# Patient Record
Sex: Male | Born: 1965 | Race: Black or African American | Hispanic: No | Marital: Single | Smoking: Current every day smoker
Health system: Southern US, Community
[De-identification: ages and names within clinical notes are randomized; demographics above are authoritative.]

## PROBLEM LIST (undated history)

## (undated) HISTORY — PX: OTHER SURGICAL HISTORY: SHX169

---

## 2008-12-30 ENCOUNTER — Ambulatory Visit: Payer: Self-pay | Admitting: Internal Medicine

## 2008-12-30 ENCOUNTER — Inpatient Hospital Stay (HOSPITAL_COMMUNITY): Admission: EM | Admit: 2008-12-30 | Discharge: 2009-02-12 | Payer: Self-pay | Admitting: Emergency Medicine

## 2009-01-22 ENCOUNTER — Ambulatory Visit: Payer: Self-pay | Admitting: Infectious Diseases

## 2009-01-27 ENCOUNTER — Ambulatory Visit: Payer: Self-pay | Admitting: Emergency Medicine

## 2009-02-05 ENCOUNTER — Ambulatory Visit: Payer: Self-pay | Admitting: Physical Medicine & Rehabilitation

## 2009-02-12 ENCOUNTER — Inpatient Hospital Stay (HOSPITAL_COMMUNITY)
Admission: EM | Admit: 2009-02-12 | Discharge: 2009-02-18 | Payer: Self-pay | Admitting: Physical Medicine & Rehabilitation

## 2009-02-12 ENCOUNTER — Ambulatory Visit: Payer: Self-pay | Admitting: Physical Medicine & Rehabilitation

## 2009-02-15 ENCOUNTER — Ambulatory Visit: Payer: Self-pay | Admitting: Physical Medicine & Rehabilitation

## 2009-02-17 ENCOUNTER — Ambulatory Visit: Payer: Self-pay | Admitting: Infectious Disease

## 2009-02-28 ENCOUNTER — Ambulatory Visit (HOSPITAL_COMMUNITY): Admission: RE | Admit: 2009-02-28 | Discharge: 2009-02-28 | Payer: Self-pay | Admitting: Nephrology

## 2009-03-10 ENCOUNTER — Ambulatory Visit: Payer: Self-pay | Admitting: Internal Medicine

## 2009-03-10 LAB — CONVERTED CEMR LAB
ALT: 14 units/L (ref 0–53)
AST: 13 units/L (ref 0–37)
CO2: 21 meq/L (ref 19–32)
Calcium: 9.6 mg/dL (ref 8.4–10.5)
Chloride: 102 meq/L (ref 96–112)
Creatinine, Ser: 1.38 mg/dL (ref 0.40–1.50)
Eosinophils Absolute: 0.2 10*3/uL (ref 0.0–0.7)
Lymphs Abs: 1.7 10*3/uL (ref 0.7–4.0)
MCV: 82.6 fL (ref 78.0–100.0)
Monocytes Relative: 8 % (ref 3–12)
Neutro Abs: 2.8 10*3/uL (ref 1.7–7.7)
Neutrophils Relative %: 54 % (ref 43–77)
Platelets: 239 10*3/uL (ref 150–400)
RBC: 4.77 M/uL (ref 4.22–5.81)
Sodium: 139 meq/L (ref 135–145)
Total Bilirubin: 0.3 mg/dL (ref 0.3–1.2)
Total Protein: 7.8 g/dL (ref 6.0–8.3)
WBC: 5.3 10*3/uL (ref 4.0–10.5)

## 2009-06-04 ENCOUNTER — Ambulatory Visit: Payer: Self-pay | Admitting: Internal Medicine

## 2009-06-04 LAB — CONVERTED CEMR LAB
BUN: 20 mg/dL (ref 6–23)
Basophils Absolute: 0.1 10*3/uL (ref 0.0–0.1)
Basophils Relative: 1 % (ref 0–1)
Calcium: 9.5 mg/dL (ref 8.4–10.5)
Creatinine, Ser: 1.61 mg/dL — ABNORMAL HIGH (ref 0.40–1.50)
Eosinophils Absolute: 0.2 10*3/uL (ref 0.0–0.7)
Eosinophils Relative: 4 % (ref 0–5)
HCT: 43.1 % (ref 39.0–52.0)
MCV: 82.7 fL (ref 78.0–100.0)
Neutrophils Relative %: 59 % (ref 43–77)
Platelets: 219 10*3/uL (ref 150–400)
RDW: 21.2 % — ABNORMAL HIGH (ref 11.5–15.5)

## 2010-05-03 LAB — RENAL FUNCTION PANEL
CO2: 23 mEq/L (ref 19–32)
CO2: 24 mEq/L (ref 19–32)
Calcium: 8.7 mg/dL (ref 8.4–10.5)
Chloride: 96 mEq/L (ref 96–112)
GFR calc non Af Amer: 16 mL/min — ABNORMAL LOW (ref >60)
Glucose, Bld: 92 mg/dL (ref 70–99)
Phosphorus: 5.4 mg/dL — ABNORMAL HIGH (ref 2.3–4.6)
Potassium: 3.7 mEq/L (ref 3.5–5.1)
Potassium: 3.8 mEq/L (ref 3.5–5.1)
Sodium: 130 mEq/L — ABNORMAL LOW (ref 135–145)
Sodium: 134 mEq/L — ABNORMAL LOW (ref 135–145)

## 2010-05-03 LAB — CBC
MCHC: 32.8 g/dL (ref 30.0–36.0)
RBC: 3.34 MIL/uL — ABNORMAL LOW (ref 4.22–5.81)
WBC: 7.5 10*3/uL (ref 4.0–10.5)

## 2010-05-18 LAB — GLUCOSE, CAPILLARY
Glucose-Capillary: 101 mg/dL — ABNORMAL HIGH (ref 70–99)
Glucose-Capillary: 104 mg/dL — ABNORMAL HIGH (ref 70–99)
Glucose-Capillary: 106 mg/dL — ABNORMAL HIGH (ref 70–99)
Glucose-Capillary: 107 mg/dL — ABNORMAL HIGH (ref 70–99)
Glucose-Capillary: 108 mg/dL — ABNORMAL HIGH (ref 70–99)
Glucose-Capillary: 108 mg/dL — ABNORMAL HIGH (ref 70–99)
Glucose-Capillary: 111 mg/dL — ABNORMAL HIGH (ref 70–99)
Glucose-Capillary: 111 mg/dL — ABNORMAL HIGH (ref 70–99)
Glucose-Capillary: 112 mg/dL — ABNORMAL HIGH (ref 70–99)
Glucose-Capillary: 113 mg/dL — ABNORMAL HIGH (ref 70–99)
Glucose-Capillary: 113 mg/dL — ABNORMAL HIGH (ref 70–99)
Glucose-Capillary: 114 mg/dL — ABNORMAL HIGH (ref 70–99)
Glucose-Capillary: 114 mg/dL — ABNORMAL HIGH (ref 70–99)
Glucose-Capillary: 116 mg/dL — ABNORMAL HIGH (ref 70–99)
Glucose-Capillary: 117 mg/dL — ABNORMAL HIGH (ref 70–99)
Glucose-Capillary: 119 mg/dL — ABNORMAL HIGH (ref 70–99)
Glucose-Capillary: 120 mg/dL — ABNORMAL HIGH (ref 70–99)
Glucose-Capillary: 122 mg/dL — ABNORMAL HIGH (ref 70–99)
Glucose-Capillary: 122 mg/dL — ABNORMAL HIGH (ref 70–99)
Glucose-Capillary: 123 mg/dL — ABNORMAL HIGH (ref 70–99)
Glucose-Capillary: 124 mg/dL — ABNORMAL HIGH (ref 70–99)
Glucose-Capillary: 126 mg/dL — ABNORMAL HIGH (ref 70–99)
Glucose-Capillary: 126 mg/dL — ABNORMAL HIGH (ref 70–99)
Glucose-Capillary: 126 mg/dL — ABNORMAL HIGH (ref 70–99)
Glucose-Capillary: 127 mg/dL — ABNORMAL HIGH (ref 70–99)
Glucose-Capillary: 129 mg/dL — ABNORMAL HIGH (ref 70–99)
Glucose-Capillary: 131 mg/dL — ABNORMAL HIGH (ref 70–99)
Glucose-Capillary: 131 mg/dL — ABNORMAL HIGH (ref 70–99)
Glucose-Capillary: 133 mg/dL — ABNORMAL HIGH (ref 70–99)
Glucose-Capillary: 134 mg/dL — ABNORMAL HIGH (ref 70–99)
Glucose-Capillary: 135 mg/dL — ABNORMAL HIGH (ref 70–99)
Glucose-Capillary: 138 mg/dL — ABNORMAL HIGH (ref 70–99)
Glucose-Capillary: 141 mg/dL — ABNORMAL HIGH (ref 70–99)
Glucose-Capillary: 142 mg/dL — ABNORMAL HIGH (ref 70–99)
Glucose-Capillary: 151 mg/dL — ABNORMAL HIGH (ref 70–99)

## 2010-05-18 LAB — CBC
HCT: 25.6 % — ABNORMAL LOW (ref 39.0–52.0)
HCT: 27.1 % — ABNORMAL LOW (ref 39.0–52.0)
HCT: 28.8 % — ABNORMAL LOW (ref 39.0–52.0)
HCT: 29.7 % — ABNORMAL LOW (ref 39.0–52.0)
Hemoglobin: 7.5 g/dL — ABNORMAL LOW (ref 13.0–17.0)
Hemoglobin: 8.4 g/dL — ABNORMAL LOW (ref 13.0–17.0)
Hemoglobin: 8.7 g/dL — ABNORMAL LOW (ref 13.0–17.0)
Hemoglobin: 8.7 g/dL — ABNORMAL LOW (ref 13.0–17.0)
Hemoglobin: 9.3 g/dL — ABNORMAL LOW (ref 13.0–17.0)
MCHC: 32 g/dL (ref 30.0–36.0)
MCHC: 32.3 g/dL (ref 30.0–36.0)
MCHC: 32.4 g/dL (ref 30.0–36.0)
MCHC: 32.8 g/dL (ref 30.0–36.0)
MCV: 84.4 fL (ref 78.0–100.0)
MCV: 85.7 fL (ref 78.0–100.0)
MCV: 86.5 fL (ref 78.0–100.0)
MCV: 88.5 fL (ref 78.0–100.0)
Platelets: 250 10*3/uL (ref 150–400)
Platelets: 303 10*3/uL (ref 150–400)
RBC: 2.67 MIL/uL — ABNORMAL LOW (ref 4.22–5.81)
RBC: 2.91 MIL/uL — ABNORMAL LOW (ref 4.22–5.81)
RBC: 3.03 MIL/uL — ABNORMAL LOW (ref 4.22–5.81)
RBC: 3.12 MIL/uL — ABNORMAL LOW (ref 4.22–5.81)
RBC: 3.32 MIL/uL — ABNORMAL LOW (ref 4.22–5.81)
RBC: 3.43 MIL/uL — ABNORMAL LOW (ref 4.22–5.81)
RDW: 20.4 % — ABNORMAL HIGH (ref 11.5–15.5)
RDW: 20.9 % — ABNORMAL HIGH (ref 11.5–15.5)
RDW: 22.2 % — ABNORMAL HIGH (ref 11.5–15.5)
WBC: 11.8 10*3/uL — ABNORMAL HIGH (ref 4.0–10.5)
WBC: 12.6 10*3/uL — ABNORMAL HIGH (ref 4.0–10.5)
WBC: 13.6 10*3/uL — ABNORMAL HIGH (ref 4.0–10.5)
WBC: 7.4 10*3/uL (ref 4.0–10.5)
WBC: 9.1 10*3/uL (ref 4.0–10.5)

## 2010-05-18 LAB — DIFFERENTIAL
Basophils Relative: 0 % (ref 0–1)
Eosinophils Absolute: 0.2 10*3/uL (ref 0.0–0.7)
Eosinophils Relative: 2 % (ref 0–5)
Lymphs Abs: 1.8 10*3/uL (ref 0.7–4.0)
Monocytes Relative: 11 % (ref 3–12)
Neutrophils Relative %: 72 % (ref 43–77)

## 2010-05-18 LAB — RENAL FUNCTION PANEL
Albumin: 2.5 g/dL — ABNORMAL LOW (ref 3.5–5.2)
Albumin: 2.5 g/dL — ABNORMAL LOW (ref 3.5–5.2)
BUN: 110 mg/dL — ABNORMAL HIGH (ref 6–23)
BUN: 122 mg/dL — ABNORMAL HIGH (ref 6–23)
BUN: 35 mg/dL — ABNORMAL HIGH (ref 6–23)
BUN: 61 mg/dL — ABNORMAL HIGH (ref 6–23)
BUN: 61 mg/dL — ABNORMAL HIGH (ref 6–23)
CO2: 21 mEq/L (ref 19–32)
CO2: 22 mEq/L (ref 19–32)
CO2: 25 mEq/L (ref 19–32)
CO2: 25 mEq/L (ref 19–32)
Calcium: 8.9 mg/dL (ref 8.4–10.5)
Calcium: 9.4 mg/dL (ref 8.4–10.5)
Chloride: 100 mEq/L (ref 96–112)
Chloride: 93 mEq/L — ABNORMAL LOW (ref 96–112)
Chloride: 95 mEq/L — ABNORMAL LOW (ref 96–112)
Chloride: 96 mEq/L (ref 96–112)
Creatinine, Ser: 5.58 mg/dL — ABNORMAL HIGH (ref 0.4–1.5)
Creatinine, Ser: 6.48 mg/dL — ABNORMAL HIGH (ref 0.4–1.5)
Creatinine, Ser: 7.82 mg/dL — ABNORMAL HIGH (ref 0.4–1.5)
Creatinine, Ser: 7.88 mg/dL — ABNORMAL HIGH (ref 0.4–1.5)
GFR calc Af Amer: 11 mL/min — ABNORMAL LOW (ref >60)
GFR calc non Af Amer: 8 mL/min — ABNORMAL LOW (ref >60)
GFR calc non Af Amer: 9 mL/min — ABNORMAL LOW (ref >60)
Glucose, Bld: 103 mg/dL — ABNORMAL HIGH (ref 70–99)
Glucose, Bld: 116 mg/dL — ABNORMAL HIGH (ref 70–99)
Glucose, Bld: 119 mg/dL — ABNORMAL HIGH (ref 70–99)
Phosphorus: 9.6 mg/dL — ABNORMAL HIGH (ref 2.3–4.6)
Potassium: 3.5 mEq/L (ref 3.5–5.1)
Potassium: 3.7 mEq/L (ref 3.5–5.1)

## 2010-05-18 LAB — POCT I-STAT 4, (NA,K, GLUC, HGB,HCT): Sodium: 139 mEq/L (ref 135–145)

## 2010-05-18 LAB — COMPREHENSIVE METABOLIC PANEL
ALT: 94 U/L — ABNORMAL HIGH (ref 0–53)
AST: 59 U/L — ABNORMAL HIGH (ref 0–37)
Albumin: 2.1 g/dL — ABNORMAL LOW (ref 3.5–5.2)
Alkaline Phosphatase: 145 U/L — ABNORMAL HIGH (ref 39–117)
Alkaline Phosphatase: 152 U/L — ABNORMAL HIGH (ref 39–117)
Alkaline Phosphatase: 153 U/L — ABNORMAL HIGH (ref 39–117)
BUN: 101 mg/dL — ABNORMAL HIGH (ref 6–23)
BUN: 114 mg/dL — ABNORMAL HIGH (ref 6–23)
BUN: 91 mg/dL — ABNORMAL HIGH (ref 6–23)
CO2: 22 mEq/L (ref 19–32)
CO2: 22 mEq/L (ref 19–32)
CO2: 24 mEq/L (ref 19–32)
Chloride: 89 mEq/L — ABNORMAL LOW (ref 96–112)
Chloride: 99 mEq/L (ref 96–112)
Creatinine, Ser: 5.01 mg/dL — ABNORMAL HIGH (ref 0.4–1.5)
Creatinine, Ser: 7.79 mg/dL — ABNORMAL HIGH (ref 0.4–1.5)
GFR calc Af Amer: 12 mL/min — ABNORMAL LOW (ref >60)
GFR calc non Af Amer: 10 mL/min — ABNORMAL LOW (ref >60)
GFR calc non Af Amer: 8 mL/min — ABNORMAL LOW (ref >60)
Glucose, Bld: 107 mg/dL — ABNORMAL HIGH (ref 70–99)
Glucose, Bld: 120 mg/dL — ABNORMAL HIGH (ref 70–99)
Glucose, Bld: 121 mg/dL — ABNORMAL HIGH (ref 70–99)
Potassium: 3.8 mEq/L (ref 3.5–5.1)
Potassium: 4.4 mEq/L (ref 3.5–5.1)
Potassium: 4.5 mEq/L (ref 3.5–5.1)
Sodium: 137 mEq/L (ref 135–145)
Total Bilirubin: 0.6 mg/dL (ref 0.3–1.2)
Total Bilirubin: 1 mg/dL (ref 0.3–1.2)
Total Protein: 9 g/dL — ABNORMAL HIGH (ref 6.0–8.3)

## 2010-05-18 LAB — IRON AND TIBC
Iron: 22 ug/dL — ABNORMAL LOW (ref 42–135)
Saturation Ratios: 13 % — ABNORMAL LOW (ref 20–55)
TIBC: 169 ug/dL — ABNORMAL LOW (ref 215–435)
UIBC: 147 ug/dL

## 2010-05-18 LAB — BASIC METABOLIC PANEL
BUN: 139 mg/dL — ABNORMAL HIGH (ref 6–23)
CO2: 19 mEq/L (ref 19–32)
CO2: 21 mEq/L (ref 19–32)
Chloride: 98 mEq/L (ref 96–112)
Chloride: 99 mEq/L (ref 96–112)
Creatinine, Ser: 5.45 mg/dL — ABNORMAL HIGH (ref 0.4–1.5)
Creatinine, Ser: 7.19 mg/dL — ABNORMAL HIGH (ref 0.4–1.5)
GFR calc Af Amer: 14 mL/min — ABNORMAL LOW (ref >60)
Glucose, Bld: 115 mg/dL — ABNORMAL HIGH (ref 70–99)

## 2010-05-18 LAB — MAGNESIUM: Magnesium: 1.8 mg/dL (ref 1.5–2.5)

## 2010-05-18 LAB — WOUND CULTURE

## 2010-05-18 LAB — PTH, INTACT AND CALCIUM: Calcium, Total (PTH): 8.1 mg/dL — ABNORMAL LOW (ref 8.4–10.5)

## 2010-05-18 LAB — RETICULOCYTES
RBC.: 3.49 MIL/uL — ABNORMAL LOW (ref 4.22–5.81)
Retic Count, Absolute: 83.8 10*3/uL (ref 19.0–186.0)
Retic Ct Pct: 3.2 % — ABNORMAL HIGH (ref 0.4–3.1)

## 2010-05-18 LAB — NO BLOOD PRODUCTS

## 2010-05-18 LAB — PHOSPHORUS: Phosphorus: 6.5 mg/dL — ABNORMAL HIGH (ref 2.3–4.6)

## 2010-05-18 LAB — PREALBUMIN: Prealbumin: 30 mg/dL (ref 18.0–45.0)

## 2010-05-19 LAB — POCT I-STAT 7, (LYTES, BLD GAS, ICA,H+H)
Acid-Base Excess: 1 mmol/L (ref 0.0–2.0)
Acid-Base Excess: 10 mmol/L — ABNORMAL HIGH (ref 0.0–2.0)
Acid-Base Excess: 10 mmol/L — ABNORMAL HIGH (ref 0.0–2.0)
Acid-Base Excess: 11 mmol/L — ABNORMAL HIGH (ref 0.0–2.0)
Acid-Base Excess: 13 mmol/L — ABNORMAL HIGH (ref 0.0–2.0)
Acid-Base Excess: 13 mmol/L — ABNORMAL HIGH (ref 0.0–2.0)
Acid-Base Excess: 13 mmol/L — ABNORMAL HIGH (ref 0.0–2.0)
Acid-Base Excess: 16 mmol/L — ABNORMAL HIGH (ref 0.0–2.0)
Acid-Base Excess: 16 mmol/L — ABNORMAL HIGH (ref 0.0–2.0)
Acid-Base Excess: 19 mmol/L — ABNORMAL HIGH (ref 0.0–2.0)
Acid-Base Excess: 4 mmol/L — ABNORMAL HIGH (ref 0.0–2.0)
Acid-Base Excess: 4 mmol/L — ABNORMAL HIGH (ref 0.0–2.0)
Acid-Base Excess: 7 mmol/L — ABNORMAL HIGH (ref 0.0–2.0)
Acid-Base Excess: 9 mmol/L — ABNORMAL HIGH (ref 0.0–2.0)
Bicarbonate: 30.4 mEq/L — ABNORMAL HIGH (ref 20.0–24.0)
Bicarbonate: 31.3 mEq/L — ABNORMAL HIGH (ref 20.0–24.0)
Bicarbonate: 32.6 mEq/L — ABNORMAL HIGH (ref 20.0–24.0)
Bicarbonate: 33.4 mEq/L — ABNORMAL HIGH (ref 20.0–24.0)
Bicarbonate: 35.1 mEq/L — ABNORMAL HIGH (ref 20.0–24.0)
Bicarbonate: 35.1 mEq/L — ABNORMAL HIGH (ref 20.0–24.0)
Bicarbonate: 35.2 mEq/L — ABNORMAL HIGH (ref 20.0–24.0)
Bicarbonate: 35.3 mEq/L — ABNORMAL HIGH (ref 20.0–24.0)
Bicarbonate: 36.4 mEq/L — ABNORMAL HIGH (ref 20.0–24.0)
Bicarbonate: 36.7 mEq/L — ABNORMAL HIGH (ref 20.0–24.0)
Bicarbonate: 37.7 mEq/L — ABNORMAL HIGH (ref 20.0–24.0)
Bicarbonate: 38 mEq/L — ABNORMAL HIGH (ref 20.0–24.0)
Bicarbonate: 38.6 mEq/L — ABNORMAL HIGH (ref 20.0–24.0)
Bicarbonate: 39.7 mEq/L — ABNORMAL HIGH (ref 20.0–24.0)
Bicarbonate: 40.7 mEq/L — ABNORMAL HIGH (ref 20.0–24.0)
Bicarbonate: 40.8 mEq/L — ABNORMAL HIGH (ref 20.0–24.0)
Bicarbonate: 41.4 mEq/L — ABNORMAL HIGH (ref 20.0–24.0)
Bicarbonate: 41.7 mEq/L — ABNORMAL HIGH (ref 20.0–24.0)
Bicarbonate: 43.1 mEq/L — ABNORMAL HIGH (ref 20.0–24.0)
Bicarbonate: 45.1 mEq/L — ABNORMAL HIGH (ref 20.0–24.0)
Bicarbonate: 51.8 mEq/L — ABNORMAL HIGH (ref 20.0–24.0)
Calcium, Ion: 0.4 mmol/L — ABNORMAL LOW (ref 1.12–1.32)
Calcium, Ion: 0.43 mmol/L — ABNORMAL LOW (ref 1.12–1.32)
Calcium, Ion: 0.49 mmol/L — ABNORMAL LOW (ref 1.12–1.32)
Calcium, Ion: 1.09 mmol/L — ABNORMAL LOW (ref 1.12–1.32)
Calcium, Ion: 1.1 mmol/L — ABNORMAL LOW (ref 1.12–1.32)
Calcium, Ion: 1.11 mmol/L — ABNORMAL LOW (ref 1.12–1.32)
Calcium, Ion: 1.15 mmol/L (ref 1.12–1.32)
Calcium, Ion: 1.15 mmol/L (ref 1.12–1.32)
Calcium, Ion: 1.16 mmol/L (ref 1.12–1.32)
Calcium, Ion: 1.16 mmol/L (ref 1.12–1.32)
Calcium, Ion: 1.16 mmol/L (ref 1.12–1.32)
Calcium, Ion: 1.18 mmol/L (ref 1.12–1.32)
Calcium, Ion: 1.2 mmol/L (ref 1.12–1.32)
Calcium, Ion: 1.22 mmol/L (ref 1.12–1.32)
Calcium, Ion: 1.22 mmol/L (ref 1.12–1.32)
Calcium, Ion: 1.26 mmol/L (ref 1.12–1.32)
Calcium, Ion: 1.26 mmol/L (ref 1.12–1.32)
Calcium, Ion: 1.26 mmol/L (ref 1.12–1.32)
HCT: 17 % — ABNORMAL LOW (ref 39.0–52.0)
HCT: 18 % — ABNORMAL LOW (ref 39.0–52.0)
HCT: 18 % — ABNORMAL LOW (ref 39.0–52.0)
HCT: 19 % — ABNORMAL LOW (ref 39.0–52.0)
HCT: 19 % — ABNORMAL LOW (ref 39.0–52.0)
HCT: 19 % — ABNORMAL LOW (ref 39.0–52.0)
HCT: 20 % — ABNORMAL LOW (ref 39.0–52.0)
HCT: 20 % — ABNORMAL LOW (ref 39.0–52.0)
HCT: 21 % — ABNORMAL LOW (ref 39.0–52.0)
HCT: 21 % — ABNORMAL LOW (ref 39.0–52.0)
HCT: 21 % — ABNORMAL LOW (ref 39.0–52.0)
HCT: 21 % — ABNORMAL LOW (ref 39.0–52.0)
HCT: 22 % — ABNORMAL LOW (ref 39.0–52.0)
HCT: 22 % — ABNORMAL LOW (ref 39.0–52.0)
HCT: 22 % — ABNORMAL LOW (ref 39.0–52.0)
HCT: 22 % — ABNORMAL LOW (ref 39.0–52.0)
HCT: 22 % — ABNORMAL LOW (ref 39.0–52.0)
HCT: 23 % — ABNORMAL LOW (ref 39.0–52.0)
HCT: 24 % — ABNORMAL LOW (ref 39.0–52.0)
HCT: 24 % — ABNORMAL LOW (ref 39.0–52.0)
HCT: 24 % — ABNORMAL LOW (ref 39.0–52.0)
HCT: 26 % — ABNORMAL LOW (ref 39.0–52.0)
HCT: 26 % — ABNORMAL LOW (ref 39.0–52.0)
HCT: 27 % — ABNORMAL LOW (ref 39.0–52.0)
HCT: 28 % — ABNORMAL LOW (ref 39.0–52.0)
Hemoglobin: 5.8 g/dL — CL (ref 13.0–17.0)
Hemoglobin: 5.8 g/dL — CL (ref 13.0–17.0)
Hemoglobin: 6.1 g/dL — CL (ref 13.0–17.0)
Hemoglobin: 6.5 g/dL — CL (ref 13.0–17.0)
Hemoglobin: 6.5 g/dL — CL (ref 13.0–17.0)
Hemoglobin: 6.5 g/dL — CL (ref 13.0–17.0)
Hemoglobin: 6.8 g/dL — CL (ref 13.0–17.0)
Hemoglobin: 6.8 g/dL — CL (ref 13.0–17.0)
Hemoglobin: 7.1 g/dL — ABNORMAL LOW (ref 13.0–17.0)
Hemoglobin: 7.1 g/dL — ABNORMAL LOW (ref 13.0–17.0)
Hemoglobin: 7.5 g/dL — ABNORMAL LOW (ref 13.0–17.0)
Hemoglobin: 7.5 g/dL — ABNORMAL LOW (ref 13.0–17.0)
Hemoglobin: 7.8 g/dL — ABNORMAL LOW (ref 13.0–17.0)
Hemoglobin: 7.8 g/dL — ABNORMAL LOW (ref 13.0–17.0)
Hemoglobin: 7.8 g/dL — ABNORMAL LOW (ref 13.0–17.0)
Hemoglobin: 8.2 g/dL — ABNORMAL LOW (ref 13.0–17.0)
Hemoglobin: 8.2 g/dL — ABNORMAL LOW (ref 13.0–17.0)
Hemoglobin: 8.8 g/dL — ABNORMAL LOW (ref 13.0–17.0)
Hemoglobin: 8.8 g/dL — ABNORMAL LOW (ref 13.0–17.0)
Hemoglobin: 9.2 g/dL — ABNORMAL LOW (ref 13.0–17.0)
Hemoglobin: 9.5 g/dL — ABNORMAL LOW (ref 13.0–17.0)
O2 Saturation: 67 %
O2 Saturation: 91 %
O2 Saturation: 92 %
O2 Saturation: 92 %
O2 Saturation: 93 %
O2 Saturation: 93 %
O2 Saturation: 94 %
O2 Saturation: 95 %
O2 Saturation: 95 %
O2 Saturation: 95 %
O2 Saturation: 96 %
O2 Saturation: 96 %
O2 Saturation: 99 %
O2 Saturation: 99 %
O2 Saturation: 99 %
O2 Saturation: 99 %
Patient temperature: 100.3
Patient temperature: 101.8
Patient temperature: 96
Patient temperature: 96.5
Patient temperature: 97.2
Patient temperature: 97.4
Patient temperature: 98
Patient temperature: 98.1
Patient temperature: 98.3
Patient temperature: 98.6
Patient temperature: 98.8
Patient temperature: 98.9
Patient temperature: 98.9
Patient temperature: 99.1
Patient temperature: 99.1
Patient temperature: 99.5
Patient temperature: 99.8
Potassium: 3 mEq/L — ABNORMAL LOW (ref 3.5–5.1)
Potassium: 3.1 mEq/L — ABNORMAL LOW (ref 3.5–5.1)
Potassium: 3.2 mEq/L — ABNORMAL LOW (ref 3.5–5.1)
Potassium: 3.3 mEq/L — ABNORMAL LOW (ref 3.5–5.1)
Potassium: 3.4 mEq/L — ABNORMAL LOW (ref 3.5–5.1)
Potassium: 3.4 mEq/L — ABNORMAL LOW (ref 3.5–5.1)
Potassium: 3.5 mEq/L (ref 3.5–5.1)
Potassium: 3.5 mEq/L (ref 3.5–5.1)
Potassium: 3.6 mEq/L (ref 3.5–5.1)
Potassium: 3.6 mEq/L (ref 3.5–5.1)
Potassium: 3.7 mEq/L (ref 3.5–5.1)
Potassium: 3.7 mEq/L (ref 3.5–5.1)
Potassium: 3.7 mEq/L (ref 3.5–5.1)
Potassium: 3.7 mEq/L (ref 3.5–5.1)
Potassium: 3.9 mEq/L (ref 3.5–5.1)
Potassium: 4 mEq/L (ref 3.5–5.1)
Potassium: 4 mEq/L (ref 3.5–5.1)
Sodium: 134 mEq/L — ABNORMAL LOW (ref 135–145)
Sodium: 134 mEq/L — ABNORMAL LOW (ref 135–145)
Sodium: 134 mEq/L — ABNORMAL LOW (ref 135–145)
Sodium: 135 mEq/L (ref 135–145)
Sodium: 135 mEq/L (ref 135–145)
Sodium: 135 mEq/L (ref 135–145)
Sodium: 135 mEq/L (ref 135–145)
Sodium: 136 mEq/L (ref 135–145)
Sodium: 136 mEq/L (ref 135–145)
Sodium: 136 mEq/L (ref 135–145)
Sodium: 137 mEq/L (ref 135–145)
Sodium: 137 mEq/L (ref 135–145)
Sodium: 137 mEq/L (ref 135–145)
Sodium: 137 mEq/L (ref 135–145)
Sodium: 138 mEq/L (ref 135–145)
Sodium: 138 mEq/L (ref 135–145)
Sodium: 138 mEq/L (ref 135–145)
Sodium: 138 mEq/L (ref 135–145)
Sodium: 138 mEq/L (ref 135–145)
Sodium: 138 mEq/L (ref 135–145)
Sodium: 139 mEq/L (ref 135–145)
Sodium: 139 mEq/L (ref 135–145)
Sodium: 139 mEq/L (ref 135–145)
TCO2: 32 mmol/L (ref 0–100)
TCO2: 32 mmol/L (ref 0–100)
TCO2: 33 mmol/L (ref 0–100)
TCO2: 34 mmol/L (ref 0–100)
TCO2: 35 mmol/L (ref 0–100)
TCO2: 35 mmol/L (ref 0–100)
TCO2: 37 mmol/L (ref 0–100)
TCO2: 37 mmol/L (ref 0–100)
TCO2: 37 mmol/L (ref 0–100)
TCO2: 39 mmol/L (ref 0–100)
TCO2: 40 mmol/L (ref 0–100)
TCO2: 40 mmol/L (ref 0–100)
TCO2: 40 mmol/L (ref 0–100)
TCO2: 41 mmol/L (ref 0–100)
TCO2: 43 mmol/L (ref 0–100)
TCO2: 45 mmol/L (ref 0–100)
TCO2: 47 mmol/L (ref 0–100)
pCO2 arterial: 37.5 mmHg (ref 35.0–45.0)
pCO2 arterial: 47.3 mmHg — ABNORMAL HIGH (ref 35.0–45.0)
pCO2 arterial: 47.8 mmHg — ABNORMAL HIGH (ref 35.0–45.0)
pCO2 arterial: 47.8 mmHg — ABNORMAL HIGH (ref 35.0–45.0)
pCO2 arterial: 49.8 mmHg — ABNORMAL HIGH (ref 35.0–45.0)
pCO2 arterial: 51.1 mmHg — ABNORMAL HIGH (ref 35.0–45.0)
pCO2 arterial: 51.7 mmHg — ABNORMAL HIGH (ref 35.0–45.0)
pCO2 arterial: 52.9 mmHg — ABNORMAL HIGH (ref 35.0–45.0)
pCO2 arterial: 53.9 mmHg — ABNORMAL HIGH (ref 35.0–45.0)
pCO2 arterial: 54.3 mmHg — ABNORMAL HIGH (ref 35.0–45.0)
pCO2 arterial: 54.4 mmHg — ABNORMAL HIGH (ref 35.0–45.0)
pCO2 arterial: 55.9 mmHg — ABNORMAL HIGH (ref 35.0–45.0)
pCO2 arterial: 57.4 mmHg (ref 35.0–45.0)
pCO2 arterial: 58.3 mmHg (ref 35.0–45.0)
pCO2 arterial: 58.9 mmHg (ref 35.0–45.0)
pCO2 arterial: 59.1 mmHg (ref 35.0–45.0)
pCO2 arterial: 62.3 mmHg (ref 35.0–45.0)
pCO2 arterial: 62.8 mmHg (ref 35.0–45.0)
pCO2 arterial: 71.2 mmHg (ref 35.0–45.0)
pH, Arterial: 7.329 — ABNORMAL LOW (ref 7.350–7.450)
pH, Arterial: 7.357 (ref 7.350–7.450)
pH, Arterial: 7.376 (ref 7.350–7.450)
pH, Arterial: 7.384 (ref 7.350–7.450)
pH, Arterial: 7.415 (ref 7.350–7.450)
pH, Arterial: 7.424 (ref 7.350–7.450)
pH, Arterial: 7.433 (ref 7.350–7.450)
pH, Arterial: 7.434 (ref 7.350–7.450)
pH, Arterial: 7.441 (ref 7.350–7.450)
pH, Arterial: 7.443 (ref 7.350–7.450)
pH, Arterial: 7.444 (ref 7.350–7.450)
pH, Arterial: 7.446 (ref 7.350–7.450)
pH, Arterial: 7.447 (ref 7.350–7.450)
pH, Arterial: 7.45 (ref 7.350–7.450)
pH, Arterial: 7.462 — ABNORMAL HIGH (ref 7.350–7.450)
pH, Arterial: 7.47 — ABNORMAL HIGH (ref 7.350–7.450)
pH, Arterial: 7.48 — ABNORMAL HIGH (ref 7.350–7.450)
pH, Arterial: 7.482 — ABNORMAL HIGH (ref 7.350–7.450)
pH, Arterial: 7.495 — ABNORMAL HIGH (ref 7.350–7.450)
pH, Arterial: 7.503 — ABNORMAL HIGH (ref 7.350–7.450)
pH, Arterial: 7.51 — ABNORMAL HIGH (ref 7.350–7.450)
pH, Arterial: 7.52 — ABNORMAL HIGH (ref 7.350–7.450)
pH, Arterial: 7.542 — ABNORMAL HIGH (ref 7.350–7.450)
pO2, Arterial: 171 mmHg — ABNORMAL HIGH (ref 80.0–100.0)
pO2, Arterial: 35 mmHg — CL (ref 80.0–100.0)
pO2, Arterial: 36 mmHg — CL (ref 80.0–100.0)
pO2, Arterial: 39 mmHg — CL (ref 80.0–100.0)
pO2, Arterial: 44 mmHg — ABNORMAL LOW (ref 80.0–100.0)
pO2, Arterial: 54 mmHg — ABNORMAL LOW (ref 80.0–100.0)
pO2, Arterial: 59 mmHg — ABNORMAL LOW (ref 80.0–100.0)
pO2, Arterial: 61 mmHg — ABNORMAL LOW (ref 80.0–100.0)
pO2, Arterial: 61 mmHg — ABNORMAL LOW (ref 80.0–100.0)
pO2, Arterial: 62 mmHg — ABNORMAL LOW (ref 80.0–100.0)
pO2, Arterial: 65 mmHg — ABNORMAL LOW (ref 80.0–100.0)
pO2, Arterial: 65 mmHg — ABNORMAL LOW (ref 80.0–100.0)
pO2, Arterial: 66 mmHg — ABNORMAL LOW (ref 80.0–100.0)
pO2, Arterial: 66 mmHg — ABNORMAL LOW (ref 80.0–100.0)
pO2, Arterial: 70 mmHg — ABNORMAL LOW (ref 80.0–100.0)
pO2, Arterial: 71 mmHg — ABNORMAL LOW (ref 80.0–100.0)
pO2, Arterial: 78 mmHg — ABNORMAL LOW (ref 80.0–100.0)
pO2, Arterial: 89 mmHg (ref 80.0–100.0)

## 2010-05-19 LAB — POCT I-STAT EG7
Acid-Base Excess: 10 mmol/L — ABNORMAL HIGH (ref 0.0–2.0)
Acid-Base Excess: 10 mmol/L — ABNORMAL HIGH (ref 0.0–2.0)
Acid-Base Excess: 10 mmol/L — ABNORMAL HIGH (ref 0.0–2.0)
Acid-Base Excess: 10 mmol/L — ABNORMAL HIGH (ref 0.0–2.0)
Acid-Base Excess: 11 mmol/L — ABNORMAL HIGH (ref 0.0–2.0)
Acid-Base Excess: 11 mmol/L — ABNORMAL HIGH (ref 0.0–2.0)
Acid-Base Excess: 11 mmol/L — ABNORMAL HIGH (ref 0.0–2.0)
Acid-Base Excess: 12 mmol/L — ABNORMAL HIGH (ref 0.0–2.0)
Acid-Base Excess: 12 mmol/L — ABNORMAL HIGH (ref 0.0–2.0)
Acid-Base Excess: 12 mmol/L — ABNORMAL HIGH (ref 0.0–2.0)
Acid-Base Excess: 14 mmol/L — ABNORMAL HIGH (ref 0.0–2.0)
Acid-Base Excess: 14 mmol/L — ABNORMAL HIGH (ref 0.0–2.0)
Acid-Base Excess: 14 mmol/L — ABNORMAL HIGH (ref 0.0–2.0)
Acid-Base Excess: 15 mmol/L — ABNORMAL HIGH (ref 0.0–2.0)
Acid-Base Excess: 5 mmol/L — ABNORMAL HIGH (ref 0.0–2.0)
Acid-Base Excess: 6 mmol/L — ABNORMAL HIGH (ref 0.0–2.0)
Acid-Base Excess: 7 mmol/L — ABNORMAL HIGH (ref 0.0–2.0)
Acid-Base Excess: 7 mmol/L — ABNORMAL HIGH (ref 0.0–2.0)
Acid-Base Excess: 8 mmol/L — ABNORMAL HIGH (ref 0.0–2.0)
Acid-Base Excess: 8 mmol/L — ABNORMAL HIGH (ref 0.0–2.0)
Acid-Base Excess: 9 mmol/L — ABNORMAL HIGH (ref 0.0–2.0)
Acid-Base Excess: 9 mmol/L — ABNORMAL HIGH (ref 0.0–2.0)
Acid-Base Excess: 9 mmol/L — ABNORMAL HIGH (ref 0.0–2.0)
Acid-Base Excess: 9 mmol/L — ABNORMAL HIGH (ref 0.0–2.0)
Acid-Base Excess: 9 mmol/L — ABNORMAL HIGH (ref 0.0–2.0)
Bicarbonate: 28.5 mEq/L — ABNORMAL HIGH (ref 20.0–24.0)
Bicarbonate: 32.1 mEq/L — ABNORMAL HIGH (ref 20.0–24.0)
Bicarbonate: 33.2 mEq/L — ABNORMAL HIGH (ref 20.0–24.0)
Bicarbonate: 33.2 mEq/L — ABNORMAL HIGH (ref 20.0–24.0)
Bicarbonate: 33.5 mEq/L — ABNORMAL HIGH (ref 20.0–24.0)
Bicarbonate: 35.2 mEq/L — ABNORMAL HIGH (ref 20.0–24.0)
Bicarbonate: 35.8 mEq/L — ABNORMAL HIGH (ref 20.0–24.0)
Bicarbonate: 36 mEq/L — ABNORMAL HIGH (ref 20.0–24.0)
Bicarbonate: 37.7 mEq/L — ABNORMAL HIGH (ref 20.0–24.0)
Bicarbonate: 38.1 mEq/L — ABNORMAL HIGH (ref 20.0–24.0)
Bicarbonate: 38.4 mEq/L — ABNORMAL HIGH (ref 20.0–24.0)
Bicarbonate: 38.7 mEq/L — ABNORMAL HIGH (ref 20.0–24.0)
Bicarbonate: 38.7 mEq/L — ABNORMAL HIGH (ref 20.0–24.0)
Bicarbonate: 39.5 mEq/L — ABNORMAL HIGH (ref 20.0–24.0)
Bicarbonate: 40.5 mEq/L — ABNORMAL HIGH (ref 20.0–24.0)
Calcium, Ion: 0.28 mmol/L — ABNORMAL LOW (ref 1.12–1.32)
Calcium, Ion: 0.36 mmol/L — ABNORMAL LOW (ref 1.12–1.32)
Calcium, Ion: 0.36 mmol/L — ABNORMAL LOW (ref 1.12–1.32)
Calcium, Ion: 0.38 mmol/L — ABNORMAL LOW (ref 1.12–1.32)
Calcium, Ion: 0.39 mmol/L — ABNORMAL LOW (ref 1.12–1.32)
Calcium, Ion: 0.4 mmol/L — ABNORMAL LOW (ref 1.12–1.32)
Calcium, Ion: 0.41 mmol/L — ABNORMAL LOW (ref 1.12–1.32)
Calcium, Ion: 0.41 mmol/L — ABNORMAL LOW (ref 1.12–1.32)
Calcium, Ion: 0.42 mmol/L — ABNORMAL LOW (ref 1.12–1.32)
Calcium, Ion: 0.42 mmol/L — ABNORMAL LOW (ref 1.12–1.32)
Calcium, Ion: 0.43 mmol/L — ABNORMAL LOW (ref 1.12–1.32)
Calcium, Ion: 0.43 mmol/L — ABNORMAL LOW (ref 1.12–1.32)
Calcium, Ion: 0.43 mmol/L — ABNORMAL LOW (ref 1.12–1.32)
Calcium, Ion: 0.44 mmol/L — ABNORMAL LOW (ref 1.12–1.32)
Calcium, Ion: 0.44 mmol/L — ABNORMAL LOW (ref 1.12–1.32)
Calcium, Ion: 0.44 mmol/L — ABNORMAL LOW (ref 1.12–1.32)
Calcium, Ion: 0.44 mmol/L — ABNORMAL LOW (ref 1.12–1.32)
Calcium, Ion: 0.46 mmol/L — ABNORMAL LOW (ref 1.12–1.32)
Calcium, Ion: 1.13 mmol/L (ref 1.12–1.32)
Calcium, Ion: 1.23 mmol/L (ref 1.12–1.32)
Calcium, Ion: <0.25 mmol/L — ABNORMAL LOW (ref 1.12–1.32)
HCT: 22 % — ABNORMAL LOW (ref 39.0–52.0)
HCT: 23 % — ABNORMAL LOW (ref 39.0–52.0)
HCT: 23 % — ABNORMAL LOW (ref 39.0–52.0)
HCT: 24 % — ABNORMAL LOW (ref 39.0–52.0)
HCT: 24 % — ABNORMAL LOW (ref 39.0–52.0)
HCT: 24 % — ABNORMAL LOW (ref 39.0–52.0)
HCT: 25 % — ABNORMAL LOW (ref 39.0–52.0)
HCT: 26 % — ABNORMAL LOW (ref 39.0–52.0)
HCT: 28 % — ABNORMAL LOW (ref 39.0–52.0)
HCT: 28 % — ABNORMAL LOW (ref 39.0–52.0)
HCT: 29 % — ABNORMAL LOW (ref 39.0–52.0)
HCT: 29 % — ABNORMAL LOW (ref 39.0–52.0)
HCT: 29 % — ABNORMAL LOW (ref 39.0–52.0)
HCT: 29 % — ABNORMAL LOW (ref 39.0–52.0)
HCT: 30 % — ABNORMAL LOW (ref 39.0–52.0)
HCT: 31 % — ABNORMAL LOW (ref 39.0–52.0)
HCT: 32 % — ABNORMAL LOW (ref 39.0–52.0)
Hemoglobin: 10.5 g/dL — ABNORMAL LOW (ref 13.0–17.0)
Hemoglobin: 10.9 g/dL — ABNORMAL LOW (ref 13.0–17.0)
Hemoglobin: 7.5 g/dL — ABNORMAL LOW (ref 13.0–17.0)
Hemoglobin: 7.8 g/dL — ABNORMAL LOW (ref 13.0–17.0)
Hemoglobin: 7.8 g/dL — ABNORMAL LOW (ref 13.0–17.0)
Hemoglobin: 7.8 g/dL — ABNORMAL LOW (ref 13.0–17.0)
Hemoglobin: 8.2 g/dL — ABNORMAL LOW (ref 13.0–17.0)
Hemoglobin: 9.2 g/dL — ABNORMAL LOW (ref 13.0–17.0)
Hemoglobin: 9.5 g/dL — ABNORMAL LOW (ref 13.0–17.0)
Hemoglobin: 9.5 g/dL — ABNORMAL LOW (ref 13.0–17.0)
Hemoglobin: 9.5 g/dL — ABNORMAL LOW (ref 13.0–17.0)
Hemoglobin: 9.5 g/dL — ABNORMAL LOW (ref 13.0–17.0)
Hemoglobin: 9.9 g/dL — ABNORMAL LOW (ref 13.0–17.0)
Hemoglobin: 9.9 g/dL — ABNORMAL LOW (ref 13.0–17.0)
O2 Saturation: 42 %
O2 Saturation: 51 %
O2 Saturation: 57 %
O2 Saturation: 60 %
O2 Saturation: 61 %
O2 Saturation: 62 %
O2 Saturation: 62 %
O2 Saturation: 63 %
O2 Saturation: 63 %
O2 Saturation: 63 %
O2 Saturation: 64 %
O2 Saturation: 64 %
O2 Saturation: 65 %
O2 Saturation: 65 %
O2 Saturation: 65 %
O2 Saturation: 66 %
O2 Saturation: 66 %
O2 Saturation: 66 %
O2 Saturation: 68 %
O2 Saturation: 68 %
O2 Saturation: 72 %
O2 Saturation: 73 %
O2 Saturation: 90 %
Patient temperature: 100
Patient temperature: 100
Patient temperature: 100.3
Patient temperature: 100.6
Patient temperature: 101.8
Patient temperature: 97.4
Patient temperature: 97.5
Patient temperature: 97.9
Patient temperature: 97.9
Patient temperature: 98
Patient temperature: 98.3
Patient temperature: 98.9
Patient temperature: 99.5
Patient temperature: 99.8
Potassium: 3.1 mEq/L — ABNORMAL LOW (ref 3.5–5.1)
Potassium: 3.4 mEq/L — ABNORMAL LOW (ref 3.5–5.1)
Potassium: 3.4 mEq/L — ABNORMAL LOW (ref 3.5–5.1)
Potassium: 3.5 mEq/L (ref 3.5–5.1)
Potassium: 3.6 mEq/L (ref 3.5–5.1)
Potassium: 3.7 mEq/L (ref 3.5–5.1)
Potassium: 3.7 mEq/L (ref 3.5–5.1)
Potassium: 3.7 mEq/L (ref 3.5–5.1)
Potassium: 3.8 mEq/L (ref 3.5–5.1)
Potassium: 3.8 mEq/L (ref 3.5–5.1)
Potassium: 3.8 mEq/L (ref 3.5–5.1)
Potassium: 3.8 mEq/L (ref 3.5–5.1)
Potassium: 3.9 mEq/L (ref 3.5–5.1)
Potassium: 4.2 mEq/L (ref 3.5–5.1)
Potassium: 4.3 mEq/L (ref 3.5–5.1)
Potassium: 5.3 mEq/L — ABNORMAL HIGH (ref 3.5–5.1)
Sodium: 137 mEq/L (ref 135–145)
Sodium: 138 mEq/L (ref 135–145)
Sodium: 138 mEq/L (ref 135–145)
Sodium: 138 mEq/L (ref 135–145)
Sodium: 139 mEq/L (ref 135–145)
Sodium: 139 mEq/L (ref 135–145)
Sodium: 139 mEq/L (ref 135–145)
Sodium: 139 mEq/L (ref 135–145)
Sodium: 139 mEq/L (ref 135–145)
Sodium: 139 mEq/L (ref 135–145)
Sodium: 139 mEq/L (ref 135–145)
Sodium: 140 mEq/L (ref 135–145)
Sodium: 140 mEq/L (ref 135–145)
Sodium: 140 mEq/L (ref 135–145)
Sodium: 140 mEq/L (ref 135–145)
Sodium: 141 mEq/L (ref 135–145)
Sodium: 142 mEq/L (ref 135–145)
Sodium: 142 mEq/L (ref 135–145)
Sodium: 144 mEq/L (ref 135–145)
TCO2: 30 mmol/L (ref 0–100)
TCO2: 33 mmol/L (ref 0–100)
TCO2: 35 mmol/L (ref 0–100)
TCO2: 35 mmol/L (ref 0–100)
TCO2: 36 mmol/L (ref 0–100)
TCO2: 38 mmol/L (ref 0–100)
TCO2: 38 mmol/L (ref 0–100)
TCO2: 39 mmol/L (ref 0–100)
TCO2: 39 mmol/L (ref 0–100)
TCO2: 40 mmol/L (ref 0–100)
TCO2: 40 mmol/L (ref 0–100)
TCO2: 40 mmol/L (ref 0–100)
TCO2: 41 mmol/L (ref 0–100)
TCO2: 41 mmol/L (ref 0–100)
TCO2: 41 mmol/L (ref 0–100)
TCO2: 42 mmol/L (ref 0–100)
TCO2: 42 mmol/L (ref 0–100)
TCO2: 42 mmol/L (ref 0–100)
TCO2: 42 mmol/L (ref 0–100)
pCO2, Ven: 49.3 mmHg (ref 45.0–50.0)
pCO2, Ven: 54.4 mmHg — ABNORMAL HIGH (ref 45.0–50.0)
pCO2, Ven: 55.5 mmHg — ABNORMAL HIGH (ref 45.0–50.0)
pCO2, Ven: 57.5 mmHg — ABNORMAL HIGH (ref 45.0–50.0)
pCO2, Ven: 58 mmHg — ABNORMAL HIGH (ref 45.0–50.0)
pCO2, Ven: 59.6 mmHg — ABNORMAL HIGH (ref 45.0–50.0)
pCO2, Ven: 60.4 mmHg — ABNORMAL HIGH (ref 45.0–50.0)
pCO2, Ven: 60.5 mmHg — ABNORMAL HIGH (ref 45.0–50.0)
pCO2, Ven: 60.7 mmHg — ABNORMAL HIGH (ref 45.0–50.0)
pCO2, Ven: 61.8 mmHg — ABNORMAL HIGH (ref 45.0–50.0)
pCO2, Ven: 63.9 mmHg — ABNORMAL HIGH (ref 45.0–50.0)
pCO2, Ven: 66.3 mmHg — ABNORMAL HIGH (ref 45.0–50.0)
pCO2, Ven: 66.6 mmHg — ABNORMAL HIGH (ref 45.0–50.0)
pCO2, Ven: 66.8 mmHg — ABNORMAL HIGH (ref 45.0–50.0)
pH, Ven: 7.268 (ref 7.250–7.300)
pH, Ven: 7.328 — ABNORMAL HIGH (ref 7.250–7.300)
pH, Ven: 7.334 — ABNORMAL HIGH (ref 7.250–7.300)
pH, Ven: 7.353 — ABNORMAL HIGH (ref 7.250–7.300)
pH, Ven: 7.358 — ABNORMAL HIGH (ref 7.250–7.300)
pH, Ven: 7.36 — ABNORMAL HIGH (ref 7.250–7.300)
pH, Ven: 7.373 — ABNORMAL HIGH (ref 7.250–7.300)
pH, Ven: 7.379 — ABNORMAL HIGH (ref 7.250–7.300)
pH, Ven: 7.38 — ABNORMAL HIGH (ref 7.250–7.300)
pH, Ven: 7.39 — ABNORMAL HIGH (ref 7.250–7.300)
pH, Ven: 7.39 — ABNORMAL HIGH (ref 7.250–7.300)
pH, Ven: 7.419 — ABNORMAL HIGH (ref 7.250–7.300)
pH, Ven: 7.43 — ABNORMAL HIGH (ref 7.250–7.300)
pH, Ven: 7.442 — ABNORMAL HIGH (ref 7.250–7.300)
pO2, Ven: 29 mmHg — CL (ref 30.0–45.0)
pO2, Ven: 31 mmHg (ref 30.0–45.0)
pO2, Ven: 32 mmHg (ref 30.0–45.0)
pO2, Ven: 32 mmHg (ref 30.0–45.0)
pO2, Ven: 34 mmHg (ref 30.0–45.0)
pO2, Ven: 35 mmHg (ref 30.0–45.0)
pO2, Ven: 35 mmHg (ref 30.0–45.0)
pO2, Ven: 36 mmHg (ref 30.0–45.0)
pO2, Ven: 36 mmHg (ref 30.0–45.0)
pO2, Ven: 36 mmHg (ref 30.0–45.0)
pO2, Ven: 36 mmHg (ref 30.0–45.0)
pO2, Ven: 37 mmHg (ref 30.0–45.0)
pO2, Ven: 37 mmHg (ref 30.0–45.0)
pO2, Ven: 37 mmHg (ref 30.0–45.0)
pO2, Ven: 37 mmHg (ref 30.0–45.0)
pO2, Ven: 37 mmHg (ref 30.0–45.0)
pO2, Ven: 37 mmHg (ref 30.0–45.0)
pO2, Ven: 38 mmHg (ref 30.0–45.0)
pO2, Ven: 38 mmHg (ref 30.0–45.0)
pO2, Ven: 60 mmHg — ABNORMAL HIGH (ref 30.0–45.0)

## 2010-05-19 LAB — POCT I-STAT, CHEM 8
BUN: 37 mg/dL — ABNORMAL HIGH (ref 6–23)
BUN: 38 mg/dL — ABNORMAL HIGH (ref 6–23)
BUN: 41 mg/dL — ABNORMAL HIGH (ref 6–23)
BUN: 42 mg/dL — ABNORMAL HIGH (ref 6–23)
BUN: 47 mg/dL — ABNORMAL HIGH (ref 6–23)
BUN: 48 mg/dL — ABNORMAL HIGH (ref 6–23)
BUN: 50 mg/dL — ABNORMAL HIGH (ref 6–23)
BUN: 53 mg/dL — ABNORMAL HIGH (ref 6–23)
BUN: 59 mg/dL — ABNORMAL HIGH (ref 6–23)
BUN: 65 mg/dL — ABNORMAL HIGH (ref 6–23)
BUN: 66 mg/dL — ABNORMAL HIGH (ref 6–23)
BUN: 68 mg/dL — ABNORMAL HIGH (ref 6–23)
BUN: 70 mg/dL — ABNORMAL HIGH (ref 6–23)
BUN: 74 mg/dL — ABNORMAL HIGH (ref 6–23)
Calcium, Ion: 0.35 mmol/L — ABNORMAL LOW (ref 1.12–1.32)
Calcium, Ion: 0.45 mmol/L — ABNORMAL LOW (ref 1.12–1.32)
Calcium, Ion: 1.03 mmol/L — ABNORMAL LOW (ref 1.12–1.32)
Calcium, Ion: 1.04 mmol/L — ABNORMAL LOW (ref 1.12–1.32)
Calcium, Ion: 1.14 mmol/L (ref 1.12–1.32)
Calcium, Ion: <0.25 mmol/L — ABNORMAL LOW (ref 1.12–1.32)
Calcium, Ion: <0.25 mmol/L — ABNORMAL LOW (ref 1.12–1.32)
Chloride: 100 mEq/L (ref 96–112)
Chloride: 101 mEq/L (ref 96–112)
Chloride: 104 mEq/L (ref 96–112)
Chloride: 105 mEq/L (ref 96–112)
Chloride: 106 mEq/L (ref 96–112)
Chloride: 106 mEq/L (ref 96–112)
Chloride: 107 mEq/L (ref 96–112)
Chloride: 87 mEq/L — ABNORMAL LOW (ref 96–112)
Chloride: 92 mEq/L — ABNORMAL LOW (ref 96–112)
Chloride: 97 mEq/L (ref 96–112)
Creatinine, Ser: 2.3 mg/dL — ABNORMAL HIGH (ref 0.4–1.5)
Creatinine, Ser: 2.4 mg/dL — ABNORMAL HIGH (ref 0.4–1.5)
Creatinine, Ser: 2.4 mg/dL — ABNORMAL HIGH (ref 0.4–1.5)
Creatinine, Ser: 2.6 mg/dL — ABNORMAL HIGH (ref 0.4–1.5)
Creatinine, Ser: 3.2 mg/dL — ABNORMAL HIGH (ref 0.4–1.5)
Creatinine, Ser: 3.2 mg/dL — ABNORMAL HIGH (ref 0.4–1.5)
Creatinine, Ser: 3.4 mg/dL — ABNORMAL HIGH (ref 0.4–1.5)
Creatinine, Ser: 3.5 mg/dL — ABNORMAL HIGH (ref 0.4–1.5)
Creatinine, Ser: 3.6 mg/dL — ABNORMAL HIGH (ref 0.4–1.5)
Creatinine, Ser: 3.7 mg/dL — ABNORMAL HIGH (ref 0.4–1.5)
Creatinine, Ser: 3.7 mg/dL — ABNORMAL HIGH (ref 0.4–1.5)
Creatinine, Ser: 4.8 mg/dL — ABNORMAL HIGH (ref 0.4–1.5)
Glucose, Bld: 126 mg/dL — ABNORMAL HIGH (ref 70–99)
Glucose, Bld: 132 mg/dL — ABNORMAL HIGH (ref 70–99)
Glucose, Bld: 133 mg/dL — ABNORMAL HIGH (ref 70–99)
Glucose, Bld: 149 mg/dL — ABNORMAL HIGH (ref 70–99)
Glucose, Bld: 153 mg/dL — ABNORMAL HIGH (ref 70–99)
Glucose, Bld: 217 mg/dL — ABNORMAL HIGH (ref 70–99)
Glucose, Bld: 219 mg/dL — ABNORMAL HIGH (ref 70–99)
Glucose, Bld: 224 mg/dL — ABNORMAL HIGH (ref 70–99)
Glucose, Bld: 226 mg/dL — ABNORMAL HIGH (ref 70–99)
Glucose, Bld: 230 mg/dL — ABNORMAL HIGH (ref 70–99)
HCT: 20 % — ABNORMAL LOW (ref 39.0–52.0)
HCT: 27 % — ABNORMAL LOW (ref 39.0–52.0)
HCT: 27 % — ABNORMAL LOW (ref 39.0–52.0)
HCT: 29 % — ABNORMAL LOW (ref 39.0–52.0)
HCT: 32 % — ABNORMAL LOW (ref 39.0–52.0)
HCT: 34 % — ABNORMAL LOW (ref 39.0–52.0)
Hemoglobin: 10.2 g/dL — ABNORMAL LOW (ref 13.0–17.0)
Hemoglobin: 10.5 g/dL — ABNORMAL LOW (ref 13.0–17.0)
Hemoglobin: 10.9 g/dL — ABNORMAL LOW (ref 13.0–17.0)
Hemoglobin: 10.9 g/dL — ABNORMAL LOW (ref 13.0–17.0)
Hemoglobin: 9.2 g/dL — ABNORMAL LOW (ref 13.0–17.0)
Hemoglobin: 9.9 g/dL — ABNORMAL LOW (ref 13.0–17.0)
Hemoglobin: 9.9 g/dL — ABNORMAL LOW (ref 13.0–17.0)
Potassium: 3 mEq/L — ABNORMAL LOW (ref 3.5–5.1)
Potassium: 3.8 mEq/L (ref 3.5–5.1)
Potassium: 4.2 mEq/L (ref 3.5–5.1)
Potassium: 4.9 mEq/L (ref 3.5–5.1)
Potassium: 5 mEq/L (ref 3.5–5.1)
Potassium: 5 mEq/L (ref 3.5–5.1)
Potassium: 5.6 mEq/L — ABNORMAL HIGH (ref 3.5–5.1)
Potassium: 6.2 mEq/L — ABNORMAL HIGH (ref 3.5–5.1)
Potassium: 6.2 mEq/L — ABNORMAL HIGH (ref 3.5–5.1)
Potassium: 6.3 mEq/L (ref 3.5–5.1)
Sodium: 139 mEq/L (ref 135–145)
Sodium: 142 mEq/L (ref 135–145)
Sodium: 142 mEq/L (ref 135–145)
Sodium: 143 mEq/L (ref 135–145)
Sodium: 143 mEq/L (ref 135–145)
Sodium: 143 mEq/L (ref 135–145)
Sodium: 143 mEq/L (ref 135–145)
Sodium: 145 mEq/L (ref 135–145)
Sodium: 145 mEq/L (ref 135–145)
Sodium: 145 mEq/L (ref 135–145)
TCO2: 30 mmol/L (ref 0–100)
TCO2: 31 mmol/L (ref 0–100)
TCO2: 32 mmol/L (ref 0–100)
TCO2: 36 mmol/L (ref 0–100)
TCO2: 37 mmol/L (ref 0–100)
TCO2: 37 mmol/L (ref 0–100)
TCO2: 47 mmol/L (ref 0–100)

## 2010-05-19 LAB — CBC
HCT: 17.1 % — ABNORMAL LOW (ref 39.0–52.0)
HCT: 18.1 % — ABNORMAL LOW (ref 39.0–52.0)
HCT: 19.7 % — ABNORMAL LOW (ref 39.0–52.0)
HCT: 23.4 % — ABNORMAL LOW (ref 39.0–52.0)
Hemoglobin: 5.2 g/dL — CL (ref 13.0–17.0)
Hemoglobin: 5.6 g/dL — CL (ref 13.0–17.0)
Hemoglobin: 5.6 g/dL — CL (ref 13.0–17.0)
Hemoglobin: 6.1 g/dL — CL (ref 13.0–17.0)
Hemoglobin: 6.1 g/dL — CL (ref 13.0–17.0)
Hemoglobin: 6.6 g/dL — CL (ref 13.0–17.0)
Hemoglobin: 6.7 g/dL — CL (ref 13.0–17.0)
Hemoglobin: 7.3 g/dL — ABNORMAL LOW (ref 13.0–17.0)
MCHC: 32.2 g/dL (ref 30.0–36.0)
MCHC: 32.3 g/dL (ref 30.0–36.0)
MCHC: 32.7 g/dL (ref 30.0–36.0)
MCHC: 32.9 g/dL (ref 30.0–36.0)
MCHC: 33.2 g/dL (ref 30.0–36.0)
MCHC: 33.8 g/dL (ref 30.0–36.0)
MCV: 84.1 fL (ref 78.0–100.0)
MCV: 86.1 fL (ref 78.0–100.0)
MCV: 89.2 fL (ref 78.0–100.0)
Platelets: 280 10*3/uL (ref 150–400)
Platelets: 284 10*3/uL (ref 150–400)
Platelets: 372 10*3/uL (ref 150–400)
RBC: 1.89 MIL/uL — ABNORMAL LOW (ref 4.22–5.81)
RBC: 1.99 MIL/uL — ABNORMAL LOW (ref 4.22–5.81)
RBC: 2.02 MIL/uL — ABNORMAL LOW (ref 4.22–5.81)
RBC: 2.21 MIL/uL — ABNORMAL LOW (ref 4.22–5.81)
RBC: 2.22 MIL/uL — ABNORMAL LOW (ref 4.22–5.81)
RBC: 2.38 MIL/uL — ABNORMAL LOW (ref 4.22–5.81)
RBC: 2.45 MIL/uL — ABNORMAL LOW (ref 4.22–5.81)
RBC: 2.53 MIL/uL — ABNORMAL LOW (ref 4.22–5.81)
RBC: 2.62 MIL/uL — ABNORMAL LOW (ref 4.22–5.81)
RDW: 16.3 % — ABNORMAL HIGH (ref 11.5–15.5)
RDW: 17 % — ABNORMAL HIGH (ref 11.5–15.5)
RDW: 17.6 % — ABNORMAL HIGH (ref 11.5–15.5)
WBC: 26.5 10*3/uL — ABNORMAL HIGH (ref 4.0–10.5)
WBC: 27.5 10*3/uL — ABNORMAL HIGH (ref 4.0–10.5)
WBC: 28.3 10*3/uL — ABNORMAL HIGH (ref 4.0–10.5)
WBC: 29.3 10*3/uL — ABNORMAL HIGH (ref 4.0–10.5)
WBC: 31.6 10*3/uL — ABNORMAL HIGH (ref 4.0–10.5)
WBC: 35.5 10*3/uL — ABNORMAL HIGH (ref 4.0–10.5)
WBC: 39.9 10*3/uL — ABNORMAL HIGH (ref 4.0–10.5)
WBC: 47.7 10*3/uL — ABNORMAL HIGH (ref 4.0–10.5)
WBC: 48.3 10*3/uL — ABNORMAL HIGH (ref 4.0–10.5)

## 2010-05-19 LAB — POCT I-STAT 3, ART BLOOD GAS (G3+)
Acid-Base Excess: 1 mmol/L (ref 0.0–2.0)
Acid-Base Excess: 11 mmol/L — ABNORMAL HIGH (ref 0.0–2.0)
Acid-Base Excess: 3 mmol/L — ABNORMAL HIGH (ref 0.0–2.0)
Acid-Base Excess: 5 mmol/L — ABNORMAL HIGH (ref 0.0–2.0)
Acid-Base Excess: 7 mmol/L — ABNORMAL HIGH (ref 0.0–2.0)
Acid-Base Excess: 8 mmol/L — ABNORMAL HIGH (ref 0.0–2.0)
Acid-base deficit: 1 mmol/L (ref 0.0–2.0)
Bicarbonate: 24.8 mEq/L — ABNORMAL HIGH (ref 20.0–24.0)
Bicarbonate: 27.7 mEq/L — ABNORMAL HIGH (ref 20.0–24.0)
Bicarbonate: 28.1 mEq/L — ABNORMAL HIGH (ref 20.0–24.0)
Bicarbonate: 28.1 mEq/L — ABNORMAL HIGH (ref 20.0–24.0)
Bicarbonate: 30.8 mEq/L — ABNORMAL HIGH (ref 20.0–24.0)
Bicarbonate: 31.1 mEq/L — ABNORMAL HIGH (ref 20.0–24.0)
Bicarbonate: 33.8 mEq/L — ABNORMAL HIGH (ref 20.0–24.0)
Bicarbonate: 35 mEq/L — ABNORMAL HIGH (ref 20.0–24.0)
Bicarbonate: 35.9 mEq/L — ABNORMAL HIGH (ref 20.0–24.0)
O2 Saturation: 32 %
O2 Saturation: 40 %
O2 Saturation: 92 %
O2 Saturation: 94 %
O2 Saturation: 94 %
O2 Saturation: 95 %
O2 Saturation: 95 %
O2 Saturation: 95 %
O2 Saturation: 95 %
O2 Saturation: 97 %
Patient temperature: 100
Patient temperature: 101.6
Patient temperature: 97.4
Patient temperature: 97.4
Patient temperature: 97.4
Patient temperature: 97.4
Patient temperature: 98
Patient temperature: 99.6
TCO2: 26 mmol/L (ref 0–100)
TCO2: 26 mmol/L (ref 0–100)
TCO2: 29 mmol/L (ref 0–100)
TCO2: 29 mmol/L (ref 0–100)
TCO2: 31 mmol/L (ref 0–100)
TCO2: 32 mmol/L (ref 0–100)
TCO2: 32 mmol/L (ref 0–100)
TCO2: 34 mmol/L (ref 0–100)
TCO2: 35 mmol/L (ref 0–100)
TCO2: 36 mmol/L (ref 0–100)
TCO2: 36 mmol/L (ref 0–100)
TCO2: 37 mmol/L (ref 0–100)
TCO2: 39 mmol/L (ref 0–100)
pCO2 arterial: 39.7 mmHg (ref 35.0–45.0)
pCO2 arterial: 42.3 mmHg (ref 35.0–45.0)
pCO2 arterial: 44.7 mmHg (ref 35.0–45.0)
pCO2 arterial: 44.8 mmHg (ref 35.0–45.0)
pCO2 arterial: 46.1 mmHg — ABNORMAL HIGH (ref 35.0–45.0)
pCO2 arterial: 47.8 mmHg — ABNORMAL HIGH (ref 35.0–45.0)
pCO2 arterial: 49.8 mmHg — ABNORMAL HIGH (ref 35.0–45.0)
pCO2 arterial: 50.3 mmHg — ABNORMAL HIGH (ref 35.0–45.0)
pCO2 arterial: 50.7 mmHg — ABNORMAL HIGH (ref 35.0–45.0)
pCO2 arterial: 52.2 mmHg — ABNORMAL HIGH (ref 35.0–45.0)
pCO2 arterial: 59.4 mmHg (ref 35.0–45.0)
pH, Arterial: 7.262 — ABNORMAL LOW (ref 7.350–7.450)
pH, Arterial: 7.29 — ABNORMAL LOW (ref 7.350–7.450)
pH, Arterial: 7.319 — ABNORMAL LOW (ref 7.350–7.450)
pH, Arterial: 7.365 (ref 7.350–7.450)
pH, Arterial: 7.37 (ref 7.350–7.450)
pH, Arterial: 7.372 (ref 7.350–7.450)
pH, Arterial: 7.385 (ref 7.350–7.450)
pH, Arterial: 7.45 (ref 7.350–7.450)
pH, Arterial: 7.462 — ABNORMAL HIGH (ref 7.350–7.450)
pH, Arterial: 7.505 — ABNORMAL HIGH (ref 7.350–7.450)
pO2, Arterial: 24 mmHg — CL (ref 80.0–100.0)
pO2, Arterial: 62 mmHg — ABNORMAL LOW (ref 80.0–100.0)
pO2, Arterial: 75 mmHg — ABNORMAL LOW (ref 80.0–100.0)
pO2, Arterial: 75 mmHg — ABNORMAL LOW (ref 80.0–100.0)
pO2, Arterial: 77 mmHg — ABNORMAL LOW (ref 80.0–100.0)
pO2, Arterial: 80 mmHg (ref 80.0–100.0)
pO2, Arterial: 81 mmHg (ref 80.0–100.0)
pO2, Arterial: 82 mmHg (ref 80.0–100.0)
pO2, Arterial: 89 mmHg (ref 80.0–100.0)

## 2010-05-19 LAB — DIFFERENTIAL
Basophils Absolute: 0 10*3/uL (ref 0.0–0.1)
Basophils Absolute: 0 10*3/uL (ref 0.0–0.1)
Basophils Absolute: 0 10*3/uL (ref 0.0–0.1)
Basophils Absolute: 0 10*3/uL (ref 0.0–0.1)
Basophils Relative: 0 % (ref 0–1)
Basophils Relative: 0 % (ref 0–1)
Basophils Relative: 1 % (ref 0–1)
Eosinophils Absolute: 1.9 10*3/uL — ABNORMAL HIGH (ref 0.0–0.7)
Eosinophils Absolute: 2.1 10*3/uL — ABNORMAL HIGH (ref 0.0–0.7)
Eosinophils Relative: 15 % — ABNORMAL HIGH (ref 0–5)
Eosinophils Relative: 4 % (ref 0–5)
Eosinophils Relative: 5 % (ref 0–5)
Lymphocytes Relative: 6 % — ABNORMAL LOW (ref 12–46)
Lymphocytes Relative: 7 % — ABNORMAL LOW (ref 12–46)
Lymphocytes Relative: 9 % — ABNORMAL LOW (ref 12–46)
Lymphs Abs: 1.4 10*3/uL (ref 0.7–4.0)
Monocytes Absolute: 2.1 10*3/uL — ABNORMAL HIGH (ref 0.1–1.0)
Monocytes Absolute: 2.4 10*3/uL — ABNORMAL HIGH (ref 0.1–1.0)
Monocytes Absolute: 2.5 10*3/uL — ABNORMAL HIGH (ref 0.1–1.0)
Monocytes Absolute: 4 10*3/uL — ABNORMAL HIGH (ref 0.1–1.0)
Monocytes Relative: 11 % (ref 3–12)
Monocytes Relative: 5 % (ref 3–12)
Monocytes Relative: 7 % (ref 3–12)
Monocytes Relative: 9 % (ref 3–12)
Neutro Abs: 21 10*3/uL — ABNORMAL HIGH (ref 1.7–7.7)
Neutro Abs: 39.1 10*3/uL — ABNORMAL HIGH (ref 1.7–7.7)
Neutrophils Relative %: 69 % (ref 43–77)
Neutrophils Relative %: 82 % — ABNORMAL HIGH (ref 43–77)
Neutrophils Relative %: 88 % — ABNORMAL HIGH (ref 43–77)
Smear Review: ADEQUATE

## 2010-05-19 LAB — CULTURE, RESPIRATORY W GRAM STAIN

## 2010-05-19 LAB — GLUCOSE, CAPILLARY
Glucose-Capillary: 103 mg/dL — ABNORMAL HIGH (ref 70–99)
Glucose-Capillary: 108 mg/dL — ABNORMAL HIGH (ref 70–99)
Glucose-Capillary: 109 mg/dL — ABNORMAL HIGH (ref 70–99)
Glucose-Capillary: 109 mg/dL — ABNORMAL HIGH (ref 70–99)
Glucose-Capillary: 110 mg/dL — ABNORMAL HIGH (ref 70–99)
Glucose-Capillary: 111 mg/dL — ABNORMAL HIGH (ref 70–99)
Glucose-Capillary: 113 mg/dL — ABNORMAL HIGH (ref 70–99)
Glucose-Capillary: 114 mg/dL — ABNORMAL HIGH (ref 70–99)
Glucose-Capillary: 114 mg/dL — ABNORMAL HIGH (ref 70–99)
Glucose-Capillary: 116 mg/dL — ABNORMAL HIGH (ref 70–99)
Glucose-Capillary: 119 mg/dL — ABNORMAL HIGH (ref 70–99)
Glucose-Capillary: 120 mg/dL — ABNORMAL HIGH (ref 70–99)
Glucose-Capillary: 120 mg/dL — ABNORMAL HIGH (ref 70–99)
Glucose-Capillary: 122 mg/dL — ABNORMAL HIGH (ref 70–99)
Glucose-Capillary: 123 mg/dL — ABNORMAL HIGH (ref 70–99)
Glucose-Capillary: 124 mg/dL — ABNORMAL HIGH (ref 70–99)
Glucose-Capillary: 124 mg/dL — ABNORMAL HIGH (ref 70–99)
Glucose-Capillary: 125 mg/dL — ABNORMAL HIGH (ref 70–99)
Glucose-Capillary: 126 mg/dL — ABNORMAL HIGH (ref 70–99)
Glucose-Capillary: 126 mg/dL — ABNORMAL HIGH (ref 70–99)
Glucose-Capillary: 127 mg/dL — ABNORMAL HIGH (ref 70–99)
Glucose-Capillary: 127 mg/dL — ABNORMAL HIGH (ref 70–99)
Glucose-Capillary: 127 mg/dL — ABNORMAL HIGH (ref 70–99)
Glucose-Capillary: 129 mg/dL — ABNORMAL HIGH (ref 70–99)
Glucose-Capillary: 129 mg/dL — ABNORMAL HIGH (ref 70–99)
Glucose-Capillary: 130 mg/dL — ABNORMAL HIGH (ref 70–99)
Glucose-Capillary: 130 mg/dL — ABNORMAL HIGH (ref 70–99)
Glucose-Capillary: 131 mg/dL — ABNORMAL HIGH (ref 70–99)
Glucose-Capillary: 131 mg/dL — ABNORMAL HIGH (ref 70–99)
Glucose-Capillary: 131 mg/dL — ABNORMAL HIGH (ref 70–99)
Glucose-Capillary: 132 mg/dL — ABNORMAL HIGH (ref 70–99)
Glucose-Capillary: 134 mg/dL — ABNORMAL HIGH (ref 70–99)
Glucose-Capillary: 134 mg/dL — ABNORMAL HIGH (ref 70–99)
Glucose-Capillary: 141 mg/dL — ABNORMAL HIGH (ref 70–99)
Glucose-Capillary: 147 mg/dL — ABNORMAL HIGH (ref 70–99)
Glucose-Capillary: 148 mg/dL — ABNORMAL HIGH (ref 70–99)
Glucose-Capillary: 155 mg/dL — ABNORMAL HIGH (ref 70–99)
Glucose-Capillary: 212 mg/dL — ABNORMAL HIGH (ref 70–99)

## 2010-05-19 LAB — RENAL FUNCTION PANEL
Albumin: 1.6 g/dL — ABNORMAL LOW (ref 3.5–5.2)
Albumin: 1.6 g/dL — ABNORMAL LOW (ref 3.5–5.2)
Albumin: 1.6 g/dL — ABNORMAL LOW (ref 3.5–5.2)
Albumin: 1.7 g/dL — ABNORMAL LOW (ref 3.5–5.2)
Albumin: 1.8 g/dL — ABNORMAL LOW (ref 3.5–5.2)
Albumin: 1.8 g/dL — ABNORMAL LOW (ref 3.5–5.2)
Albumin: 1.9 g/dL — ABNORMAL LOW (ref 3.5–5.2)
BUN: 51 mg/dL — ABNORMAL HIGH (ref 6–23)
BUN: 54 mg/dL — ABNORMAL HIGH (ref 6–23)
BUN: 56 mg/dL — ABNORMAL HIGH (ref 6–23)
BUN: 63 mg/dL — ABNORMAL HIGH (ref 6–23)
CO2: 27 mEq/L (ref 19–32)
CO2: 30 mEq/L (ref 19–32)
Calcium: 8.5 mg/dL (ref 8.4–10.5)
Calcium: 9.5 mg/dL (ref 8.4–10.5)
Calcium: 9.7 mg/dL (ref 8.4–10.5)
Chloride: 91 mEq/L — ABNORMAL LOW (ref 96–112)
Chloride: 92 mEq/L — ABNORMAL LOW (ref 96–112)
Chloride: 93 mEq/L — ABNORMAL LOW (ref 96–112)
Chloride: 97 mEq/L (ref 96–112)
Chloride: 97 mEq/L (ref 96–112)
Creatinine, Ser: 3.09 mg/dL — ABNORMAL HIGH (ref 0.4–1.5)
Creatinine, Ser: 3.39 mg/dL — ABNORMAL HIGH (ref 0.4–1.5)
Creatinine, Ser: 3.4 mg/dL — ABNORMAL HIGH (ref 0.4–1.5)
Creatinine, Ser: 3.51 mg/dL — ABNORMAL HIGH (ref 0.4–1.5)
GFR calc Af Amer: 21 mL/min — ABNORMAL LOW (ref >60)
GFR calc Af Amer: 23 mL/min — ABNORMAL LOW (ref >60)
GFR calc Af Amer: 23 mL/min — ABNORMAL LOW (ref >60)
GFR calc Af Amer: 24 mL/min — ABNORMAL LOW (ref >60)
GFR calc Af Amer: 24 mL/min — ABNORMAL LOW (ref >60)
GFR calc Af Amer: 24 mL/min — ABNORMAL LOW (ref >60)
GFR calc Af Amer: 26 mL/min — ABNORMAL LOW (ref >60)
GFR calc non Af Amer: 17 mL/min — ABNORMAL LOW (ref >60)
GFR calc non Af Amer: 19 mL/min — ABNORMAL LOW (ref >60)
GFR calc non Af Amer: 19 mL/min — ABNORMAL LOW (ref >60)
GFR calc non Af Amer: 20 mL/min — ABNORMAL LOW (ref >60)
GFR calc non Af Amer: 20 mL/min — ABNORMAL LOW (ref >60)
GFR calc non Af Amer: 20 mL/min — ABNORMAL LOW (ref >60)
GFR calc non Af Amer: 22 mL/min — ABNORMAL LOW (ref >60)
Glucose, Bld: 125 mg/dL — ABNORMAL HIGH (ref 70–99)
Glucose, Bld: 126 mg/dL — ABNORMAL HIGH (ref 70–99)
Phosphorus: 3.1 mg/dL (ref 2.3–4.6)
Phosphorus: 3.5 mg/dL (ref 2.3–4.6)
Phosphorus: 3.5 mg/dL (ref 2.3–4.6)
Phosphorus: 3.8 mg/dL (ref 2.3–4.6)
Potassium: 3.3 mEq/L — ABNORMAL LOW (ref 3.5–5.1)
Potassium: 3.3 mEq/L — ABNORMAL LOW (ref 3.5–5.1)
Potassium: 3.7 mEq/L (ref 3.5–5.1)
Potassium: 3.8 mEq/L (ref 3.5–5.1)
Potassium: 4 mEq/L (ref 3.5–5.1)
Potassium: 4.1 mEq/L (ref 3.5–5.1)
Potassium: 4.2 mEq/L (ref 3.5–5.1)
Sodium: 133 mEq/L — ABNORMAL LOW (ref 135–145)
Sodium: 133 mEq/L — ABNORMAL LOW (ref 135–145)
Sodium: 133 mEq/L — ABNORMAL LOW (ref 135–145)
Sodium: 134 mEq/L — ABNORMAL LOW (ref 135–145)
Sodium: 136 mEq/L (ref 135–145)
Sodium: 136 mEq/L (ref 135–145)

## 2010-05-19 LAB — COMPREHENSIVE METABOLIC PANEL
ALT: 101 U/L — ABNORMAL HIGH (ref 0–53)
ALT: 105 U/L — ABNORMAL HIGH (ref 0–53)
ALT: 218 U/L — ABNORMAL HIGH (ref 0–53)
AST: 102 U/L — ABNORMAL HIGH (ref 0–37)
AST: 181 U/L — ABNORMAL HIGH (ref 0–37)
AST: 293 U/L — ABNORMAL HIGH (ref 0–37)
Albumin: 1.6 g/dL — ABNORMAL LOW (ref 3.5–5.2)
Albumin: 1.6 g/dL — ABNORMAL LOW (ref 3.5–5.2)
Alkaline Phosphatase: 157 U/L — ABNORMAL HIGH (ref 39–117)
Alkaline Phosphatase: 160 U/L — ABNORMAL HIGH (ref 39–117)
Alkaline Phosphatase: 184 U/L — ABNORMAL HIGH (ref 39–117)
BUN: 109 mg/dL — ABNORMAL HIGH (ref 6–23)
BUN: 79 mg/dL — ABNORMAL HIGH (ref 6–23)
CO2: 25 mEq/L (ref 19–32)
CO2: 26 mEq/L (ref 19–32)
CO2: 26 mEq/L (ref 19–32)
CO2: 28 mEq/L (ref 19–32)
CO2: 29 mEq/L (ref 19–32)
CO2: 31 mEq/L (ref 19–32)
Calcium: 7.6 mg/dL — ABNORMAL LOW (ref 8.4–10.5)
Calcium: 8.4 mg/dL (ref 8.4–10.5)
Calcium: 8.4 mg/dL (ref 8.4–10.5)
Chloride: 101 mEq/L (ref 96–112)
Chloride: 97 mEq/L (ref 96–112)
Chloride: 98 mEq/L (ref 96–112)
Creatinine, Ser: 5.75 mg/dL — ABNORMAL HIGH (ref 0.4–1.5)
Creatinine, Ser: 5.83 mg/dL — ABNORMAL HIGH (ref 0.4–1.5)
GFR calc Af Amer: 13 mL/min — ABNORMAL LOW (ref >60)
GFR calc Af Amer: 15 mL/min — ABNORMAL LOW (ref >60)
GFR calc Af Amer: 23 mL/min — ABNORMAL LOW (ref >60)
GFR calc non Af Amer: 11 mL/min — ABNORMAL LOW (ref >60)
GFR calc non Af Amer: 11 mL/min — ABNORMAL LOW (ref >60)
GFR calc non Af Amer: 12 mL/min — ABNORMAL LOW (ref >60)
GFR calc non Af Amer: 13 mL/min — ABNORMAL LOW (ref >60)
GFR calc non Af Amer: 19 mL/min — ABNORMAL LOW (ref >60)
GFR calc non Af Amer: 21 mL/min — ABNORMAL LOW (ref >60)
Glucose, Bld: 122 mg/dL — ABNORMAL HIGH (ref 70–99)
Glucose, Bld: 123 mg/dL — ABNORMAL HIGH (ref 70–99)
Glucose, Bld: 131 mg/dL — ABNORMAL HIGH (ref 70–99)
Glucose, Bld: 146 mg/dL — ABNORMAL HIGH (ref 70–99)
Potassium: 3.9 mEq/L (ref 3.5–5.1)
Potassium: 4 mEq/L (ref 3.5–5.1)
Potassium: 5.1 mEq/L (ref 3.5–5.1)
Sodium: 134 mEq/L — ABNORMAL LOW (ref 135–145)
Sodium: 137 mEq/L (ref 135–145)
Sodium: 137 mEq/L (ref 135–145)
Total Bilirubin: 0.8 mg/dL (ref 0.3–1.2)
Total Bilirubin: 1.1 mg/dL (ref 0.3–1.2)
Total Bilirubin: 1.1 mg/dL (ref 0.3–1.2)
Total Protein: 9 g/dL — ABNORMAL HIGH (ref 6.0–8.3)

## 2010-05-19 LAB — LIPASE, BLOOD: Lipase: 146 U/L — ABNORMAL HIGH (ref 11–59)

## 2010-05-19 LAB — BASIC METABOLIC PANEL
CO2: 24 mEq/L (ref 19–32)
CO2: 32 mEq/L (ref 19–32)
Calcium: 9.8 mg/dL (ref 8.4–10.5)
Chloride: 101 mEq/L (ref 96–112)
Chloride: 89 mEq/L — ABNORMAL LOW (ref 96–112)
Chloride: 96 mEq/L (ref 96–112)
Creatinine, Ser: 3.59 mg/dL — ABNORMAL HIGH (ref 0.4–1.5)
Creatinine, Ser: 7.6 mg/dL — ABNORMAL HIGH (ref 0.4–1.5)
GFR calc Af Amer: 10 mL/min — ABNORMAL LOW (ref >60)
GFR calc Af Amer: 22 mL/min — ABNORMAL LOW (ref >60)
GFR calc Af Amer: 23 mL/min — ABNORMAL LOW (ref >60)
GFR calc non Af Amer: 18 mL/min — ABNORMAL LOW (ref >60)
GFR calc non Af Amer: 19 mL/min — ABNORMAL LOW (ref >60)
Potassium: 3.9 mEq/L (ref 3.5–5.1)
Potassium: 4.2 mEq/L (ref 3.5–5.1)
Sodium: 134 mEq/L — ABNORMAL LOW (ref 135–145)
Sodium: 135 mEq/L (ref 135–145)
Sodium: 137 mEq/L (ref 135–145)
Sodium: 138 mEq/L (ref 135–145)

## 2010-05-19 LAB — CHOLESTEROL, TOTAL: Cholesterol: 103 mg/dL (ref 0–200)

## 2010-05-19 LAB — CLOSTRIDIUM DIFFICILE EIA: C difficile Toxins A+B, EIA: NEGATIVE

## 2010-05-19 LAB — FERRITIN: Ferritin: 1880 ng/mL — ABNORMAL HIGH (ref 22–322)

## 2010-05-19 LAB — IRON AND TIBC
Saturation Ratios: 10 % — ABNORMAL LOW (ref 20–55)
Saturation Ratios: 21 % (ref 20–55)
TIBC: 202 ug/dL — ABNORMAL LOW (ref 215–435)
UIBC: 151 ug/dL
UIBC: 159 ug/dL

## 2010-05-19 LAB — HIV ANTIBODY (ROUTINE TESTING W REFLEX): HIV: NONREACTIVE

## 2010-05-19 LAB — TRIGLYCERIDES: Triglycerides: 145 mg/dL (ref <150)

## 2010-05-19 LAB — PHOSPHORUS
Phosphorus: 3 mg/dL (ref 2.3–4.6)
Phosphorus: 4.3 mg/dL (ref 2.3–4.6)
Phosphorus: 4.5 mg/dL (ref 2.3–4.6)

## 2010-05-19 LAB — CULTURE, BLOOD (SINGLE)

## 2010-05-19 LAB — PROTIME-INR
INR: 1.22 (ref 0.00–1.49)
INR: 1.34 (ref 0.00–1.49)
Prothrombin Time: 15.3 seconds — ABNORMAL HIGH (ref 11.6–15.2)
Prothrombin Time: 16.5 seconds — ABNORMAL HIGH (ref 11.6–15.2)

## 2010-05-19 LAB — CULTURE, ROUTINE-ABSCESS: Culture: NO GROWTH

## 2010-05-19 LAB — CALCIUM, IONIZED: Calcium, Ion: 1.25 mmol/L (ref 1.12–1.32)

## 2010-05-19 LAB — RETICULOCYTES
Retic Ct Pct: 11.7 % — ABNORMAL HIGH (ref 0.4–3.1)
Retic Ct Pct: 7.7 % — ABNORMAL HIGH (ref 0.4–3.1)

## 2010-05-19 LAB — VANCOMYCIN, RANDOM: Vancomycin Rm: 30.7 ug/mL

## 2010-05-19 LAB — MAGNESIUM
Magnesium: 1.6 mg/dL (ref 1.5–2.5)
Magnesium: 2 mg/dL (ref 1.5–2.5)
Magnesium: 2.1 mg/dL (ref 1.5–2.5)

## 2010-05-19 LAB — PATHOLOGIST SMEAR REVIEW

## 2010-05-19 LAB — CULTURE, BLOOD (ROUTINE X 2)

## 2010-05-19 LAB — PREALBUMIN: Prealbumin: 11.2 mg/dL — ABNORMAL LOW (ref 18.0–45.0)

## 2010-05-20 LAB — GLUCOSE, CAPILLARY
Glucose-Capillary: 101 mg/dL — ABNORMAL HIGH (ref 70–99)
Glucose-Capillary: 101 mg/dL — ABNORMAL HIGH (ref 70–99)
Glucose-Capillary: 102 mg/dL — ABNORMAL HIGH (ref 70–99)
Glucose-Capillary: 102 mg/dL — ABNORMAL HIGH (ref 70–99)
Glucose-Capillary: 103 mg/dL — ABNORMAL HIGH (ref 70–99)
Glucose-Capillary: 103 mg/dL — ABNORMAL HIGH (ref 70–99)
Glucose-Capillary: 104 mg/dL — ABNORMAL HIGH (ref 70–99)
Glucose-Capillary: 111 mg/dL — ABNORMAL HIGH (ref 70–99)
Glucose-Capillary: 111 mg/dL — ABNORMAL HIGH (ref 70–99)
Glucose-Capillary: 112 mg/dL — ABNORMAL HIGH (ref 70–99)
Glucose-Capillary: 114 mg/dL — ABNORMAL HIGH (ref 70–99)
Glucose-Capillary: 114 mg/dL — ABNORMAL HIGH (ref 70–99)
Glucose-Capillary: 118 mg/dL — ABNORMAL HIGH (ref 70–99)
Glucose-Capillary: 121 mg/dL — ABNORMAL HIGH (ref 70–99)
Glucose-Capillary: 123 mg/dL — ABNORMAL HIGH (ref 70–99)
Glucose-Capillary: 123 mg/dL — ABNORMAL HIGH (ref 70–99)
Glucose-Capillary: 123 mg/dL — ABNORMAL HIGH (ref 70–99)
Glucose-Capillary: 124 mg/dL — ABNORMAL HIGH (ref 70–99)
Glucose-Capillary: 124 mg/dL — ABNORMAL HIGH (ref 70–99)
Glucose-Capillary: 126 mg/dL — ABNORMAL HIGH (ref 70–99)
Glucose-Capillary: 127 mg/dL — ABNORMAL HIGH (ref 70–99)
Glucose-Capillary: 129 mg/dL — ABNORMAL HIGH (ref 70–99)
Glucose-Capillary: 129 mg/dL — ABNORMAL HIGH (ref 70–99)
Glucose-Capillary: 131 mg/dL — ABNORMAL HIGH (ref 70–99)
Glucose-Capillary: 131 mg/dL — ABNORMAL HIGH (ref 70–99)
Glucose-Capillary: 131 mg/dL — ABNORMAL HIGH (ref 70–99)
Glucose-Capillary: 133 mg/dL — ABNORMAL HIGH (ref 70–99)
Glucose-Capillary: 135 mg/dL — ABNORMAL HIGH (ref 70–99)
Glucose-Capillary: 135 mg/dL — ABNORMAL HIGH (ref 70–99)
Glucose-Capillary: 136 mg/dL — ABNORMAL HIGH (ref 70–99)
Glucose-Capillary: 136 mg/dL — ABNORMAL HIGH (ref 70–99)
Glucose-Capillary: 140 mg/dL — ABNORMAL HIGH (ref 70–99)
Glucose-Capillary: 141 mg/dL — ABNORMAL HIGH (ref 70–99)
Glucose-Capillary: 142 mg/dL — ABNORMAL HIGH (ref 70–99)
Glucose-Capillary: 143 mg/dL — ABNORMAL HIGH (ref 70–99)
Glucose-Capillary: 144 mg/dL — ABNORMAL HIGH (ref 70–99)
Glucose-Capillary: 144 mg/dL — ABNORMAL HIGH (ref 70–99)
Glucose-Capillary: 145 mg/dL — ABNORMAL HIGH (ref 70–99)
Glucose-Capillary: 155 mg/dL — ABNORMAL HIGH (ref 70–99)
Glucose-Capillary: 156 mg/dL — ABNORMAL HIGH (ref 70–99)
Glucose-Capillary: 156 mg/dL — ABNORMAL HIGH (ref 70–99)
Glucose-Capillary: 189 mg/dL — ABNORMAL HIGH (ref 70–99)
Glucose-Capillary: 208 mg/dL — ABNORMAL HIGH (ref 70–99)
Glucose-Capillary: 226 mg/dL — ABNORMAL HIGH (ref 70–99)

## 2010-05-20 LAB — CBC
HCT: 19.5 % — ABNORMAL LOW (ref 39.0–52.0)
HCT: 20.6 % — ABNORMAL LOW (ref 39.0–52.0)
HCT: 21.6 % — ABNORMAL LOW (ref 39.0–52.0)
HCT: 24.3 % — ABNORMAL LOW (ref 39.0–52.0)
HCT: 25.3 % — ABNORMAL LOW (ref 39.0–52.0)
HCT: 40.7 % (ref 39.0–52.0)
Hemoglobin: 11.1 g/dL — ABNORMAL LOW (ref 13.0–17.0)
Hemoglobin: 6.2 g/dL — CL (ref 13.0–17.0)
Hemoglobin: 6.4 g/dL — CL (ref 13.0–17.0)
Hemoglobin: 6.6 g/dL — CL (ref 13.0–17.0)
Hemoglobin: 6.6 g/dL — CL (ref 13.0–17.0)
Hemoglobin: 7.1 g/dL — ABNORMAL LOW (ref 13.0–17.0)
Hemoglobin: 7.3 g/dL — ABNORMAL LOW (ref 13.0–17.0)
Hemoglobin: 7.5 g/dL — ABNORMAL LOW (ref 13.0–17.0)
Hemoglobin: 11.1 g/dL — ABNORMAL LOW (ref 13.0–17.0)
MCHC: 33.4 g/dL (ref 30.0–36.0)
MCHC: 33.4 g/dL (ref 30.0–36.0)
MCHC: 33.8 g/dL (ref 30.0–36.0)
MCHC: 33.9 g/dL (ref 30.0–36.0)
MCHC: 34 g/dL (ref 30.0–36.0)
MCHC: 34.2 g/dL (ref 30.0–36.0)
MCHC: 34.2 g/dL (ref 30.0–36.0)
MCHC: 34.5 g/dL (ref 30.0–36.0)
MCHC: 34.6 g/dL (ref 30.0–36.0)
MCHC: 35.2 g/dL (ref 30.0–36.0)
MCHC: 35.6 g/dL (ref 30.0–36.0)
MCHC: 33.6 g/dL (ref 30.0–36.0)
MCV: 81.7 fL (ref 78.0–100.0)
MCV: 82.1 fL (ref 78.0–100.0)
MCV: 82.3 fL (ref 78.0–100.0)
MCV: 82.3 fL (ref 78.0–100.0)
MCV: 82.6 fL (ref 78.0–100.0)
MCV: 82.7 fL (ref 78.0–100.0)
MCV: 83 fL (ref 78.0–100.0)
MCV: 83.1 fL (ref 78.0–100.0)
MCV: 83.2 fL (ref 78.0–100.0)
MCV: 83.9 fL (ref 78.0–100.0)
MCV: 84.2 fL (ref 78.0–100.0)
MCV: 83 fL (ref 78.0–100.0)
Platelets: 134 10*3/uL — ABNORMAL LOW (ref 150–400)
Platelets: 157 10*3/uL (ref 150–400)
Platelets: 205 10*3/uL (ref 150–400)
Platelets: 285 10*3/uL (ref 150–400)
Platelets: 291 10*3/uL (ref 150–400)
Platelets: 294 10*3/uL (ref 150–400)
Platelets: 317 10*3/uL (ref 150–400)
Platelets: 338 10*3/uL (ref 150–400)
Platelets: 361 10*3/uL (ref 150–400)
Platelets: 203 10*3/uL (ref 150–400)
RBC: 2.27 MIL/uL — ABNORMAL LOW (ref 4.22–5.81)
RBC: 2.29 MIL/uL — ABNORMAL LOW (ref 4.22–5.81)
RBC: 2.5 MIL/uL — ABNORMAL LOW (ref 4.22–5.81)
RBC: 2.61 MIL/uL — ABNORMAL LOW (ref 4.22–5.81)
RBC: 3.04 MIL/uL — ABNORMAL LOW (ref 4.22–5.81)
RBC: 3.94 MIL/uL — ABNORMAL LOW (ref 4.22–5.81)
RBC: 4.91 MIL/uL (ref 4.22–5.81)
RDW: 15.5 % (ref 11.5–15.5)
RDW: 15.5 % (ref 11.5–15.5)
RDW: 15.6 % — ABNORMAL HIGH (ref 11.5–15.5)
RDW: 15.7 % — ABNORMAL HIGH (ref 11.5–15.5)
RDW: 15.8 % — ABNORMAL HIGH (ref 11.5–15.5)
RDW: 16 % — ABNORMAL HIGH (ref 11.5–15.5)
RDW: 16.1 % — ABNORMAL HIGH (ref 11.5–15.5)
RDW: 16.4 % — ABNORMAL HIGH (ref 11.5–15.5)
RDW: 16.7 % — ABNORMAL HIGH (ref 11.5–15.5)
RDW: 17 % — ABNORMAL HIGH (ref 11.5–15.5)
RDW: 17 % — ABNORMAL HIGH (ref 11.5–15.5)
RDW: 17.3 % — ABNORMAL HIGH (ref 11.5–15.5)
RDW: 17.5 % — ABNORMAL HIGH (ref 11.5–15.5)
RDW: 14.9 % (ref 11.5–15.5)
WBC: 10.6 10*3/uL — ABNORMAL HIGH (ref 4.0–10.5)
WBC: 16.5 10*3/uL — ABNORMAL HIGH (ref 4.0–10.5)
WBC: 22 10*3/uL — ABNORMAL HIGH (ref 4.0–10.5)
WBC: 32.8 10*3/uL — ABNORMAL HIGH (ref 4.0–10.5)
WBC: 39.8 10*3/uL — ABNORMAL HIGH (ref 4.0–10.5)
WBC: 11.1 10*3/uL — ABNORMAL HIGH (ref 4.0–10.5)
WBC: 25.1 10*3/uL — ABNORMAL HIGH (ref 4.0–10.5)

## 2010-05-20 LAB — BLOOD GAS, ARTERIAL
Acid-base deficit: 0.3 mmol/L (ref 0.0–2.0)
Acid-base deficit: 0.8 mmol/L (ref 0.0–2.0)
Acid-base deficit: 0.8 mmol/L (ref 0.0–2.0)
Bicarbonate: 24.6 mEq/L — ABNORMAL HIGH (ref 20.0–24.0)
Bicarbonate: 26.2 mEq/L — ABNORMAL HIGH (ref 20.0–24.0)
FIO2: 0.4 %
FIO2: 0.4 %
FIO2: 0.6 %
MECHVT: 470 mL
MECHVT: 470 mL
O2 Saturation: 97.8 %
Patient temperature: 98.2
Patient temperature: 98.6
RATE: 22 resp/min
RATE: 22 resp/min
TCO2: 26 mmol/L (ref 0–100)
TCO2: 27.4 mmol/L (ref 0–100)
pCO2 arterial: 39.1 mmHg (ref 35.0–45.0)
pCO2 arterial: 45.9 mmHg — ABNORMAL HIGH (ref 35.0–45.0)
pCO2 arterial: 58.5 mmHg (ref 35.0–45.0)
pH, Arterial: 7.26 — ABNORMAL LOW (ref 7.350–7.450)
pH, Arterial: 7.44 (ref 7.350–7.450)
pO2, Arterial: 56.1 mmHg — ABNORMAL LOW (ref 80.0–100.0)
pO2, Arterial: 69.1 mmHg — ABNORMAL LOW (ref 80.0–100.0)
pO2, Arterial: 69.5 mmHg — ABNORMAL LOW (ref 80.0–100.0)

## 2010-05-20 LAB — POCT I-STAT 4, (NA,K, GLUC, HGB,HCT)
Glucose, Bld: 139 mg/dL — ABNORMAL HIGH (ref 70–99)
HCT: 41 % (ref 39.0–52.0)
Hemoglobin: 13.9 g/dL (ref 13.0–17.0)
Sodium: 138 mEq/L (ref 135–145)

## 2010-05-20 LAB — GASTRIC OCCULT BLOOD (1-CARD TO LAB): Occult Blood, Gastric: NEGATIVE

## 2010-05-20 LAB — DIFFERENTIAL
Band Neutrophils: 1 % (ref 0–10)
Basophils Absolute: 0 10*3/uL (ref 0.0–0.1)
Basophils Absolute: 0 10*3/uL (ref 0.0–0.1)
Basophils Absolute: 0 10*3/uL (ref 0.0–0.1)
Basophils Absolute: 0.1 10*3/uL (ref 0.0–0.1)
Basophils Relative: 0 % (ref 0–1)
Basophils Relative: 0 % (ref 0–1)
Basophils Relative: 0 % (ref 0–1)
Basophils Relative: 0 % (ref 0–1)
Eosinophils Absolute: 0 10*3/uL (ref 0.0–0.7)
Eosinophils Absolute: 1.2 10*3/uL — ABNORMAL HIGH (ref 0.0–0.7)
Eosinophils Relative: 0 % (ref 0–5)
Eosinophils Relative: 0 % (ref 0–5)
Eosinophils Relative: 3 % (ref 0–5)
Eosinophils Relative: 3 % (ref 0–5)
Eosinophils Relative: 3 % (ref 0–5)
Lymphocytes Relative: 13 % (ref 12–46)
Lymphocytes Relative: 3 % — ABNORMAL LOW (ref 12–46)
Lymphocytes Relative: 22 % (ref 12–46)
Lymphs Abs: 1.1 10*3/uL (ref 0.7–4.0)
Lymphs Abs: 3.4 10*3/uL (ref 0.7–4.0)
Lymphs Abs: 2.5 10*3/uL (ref 0.7–4.0)
Metamyelocytes Relative: 0 %
Monocytes Absolute: 1.2 10*3/uL — ABNORMAL HIGH (ref 0.1–1.0)
Monocytes Absolute: 1.6 10*3/uL — ABNORMAL HIGH (ref 0.1–1.0)
Monocytes Relative: 4 % (ref 3–12)
Monocytes Relative: 9 % (ref 3–12)
Myelocytes: 0 %
Neutro Abs: 20.6 10*3/uL — ABNORMAL HIGH (ref 1.7–7.7)
Neutro Abs: 8.2 10*3/uL — ABNORMAL HIGH (ref 1.7–7.7)
Neutrophils Relative %: 80 % — ABNORMAL HIGH (ref 43–77)
Neutrophils Relative %: 85 % — ABNORMAL HIGH (ref 43–77)
Neutrophils Relative %: 90 % — ABNORMAL HIGH (ref 43–77)
Promyelocytes Absolute: 0 %

## 2010-05-20 LAB — RETICULOCYTES
RBC.: 2.74 MIL/uL — ABNORMAL LOW (ref 4.22–5.81)
Retic Count, Absolute: 46.6 10*3/uL (ref 19.0–186.0)
Retic Ct Pct: 2.4 % (ref 0.4–3.1)

## 2010-05-20 LAB — POCT I-STAT 3, ART BLOOD GAS (G3+)
Acid-base deficit: 1 mmol/L (ref 0.0–2.0)
Acid-base deficit: 10 mmol/L — ABNORMAL HIGH (ref 0.0–2.0)
Acid-base deficit: 2 mmol/L (ref 0.0–2.0)
Acid-base deficit: 2 mmol/L (ref 0.0–2.0)
Acid-base deficit: 3 mmol/L — ABNORMAL HIGH (ref 0.0–2.0)
Acid-base deficit: 3 mmol/L — ABNORMAL HIGH (ref 0.0–2.0)
Acid-base deficit: 4 mmol/L — ABNORMAL HIGH (ref 0.0–2.0)
Acid-base deficit: 4 mmol/L — ABNORMAL HIGH (ref 0.0–2.0)
Acid-base deficit: 5 mmol/L — ABNORMAL HIGH (ref 0.0–2.0)
Acid-base deficit: 5 mmol/L — ABNORMAL HIGH (ref 0.0–2.0)
Acid-base deficit: 6 mmol/L — ABNORMAL HIGH (ref 0.0–2.0)
Acid-base deficit: 6 mmol/L — ABNORMAL HIGH (ref 0.0–2.0)
Acid-base deficit: 6 mmol/L — ABNORMAL HIGH (ref 0.0–2.0)
Acid-base deficit: 7 mmol/L — ABNORMAL HIGH (ref 0.0–2.0)
Acid-base deficit: 7 mmol/L — ABNORMAL HIGH (ref 0.0–2.0)
Acid-base deficit: 7 mmol/L — ABNORMAL HIGH (ref 0.0–2.0)
Bicarbonate: 18.9 mEq/L — ABNORMAL LOW (ref 20.0–24.0)
Bicarbonate: 21.3 mEq/L (ref 20.0–24.0)
Bicarbonate: 21.6 mEq/L (ref 20.0–24.0)
Bicarbonate: 22 mEq/L (ref 20.0–24.0)
Bicarbonate: 22 mEq/L (ref 20.0–24.0)
Bicarbonate: 22.5 mEq/L (ref 20.0–24.0)
Bicarbonate: 23 mEq/L (ref 20.0–24.0)
Bicarbonate: 23.3 mEq/L (ref 20.0–24.0)
Bicarbonate: 23.4 mEq/L (ref 20.0–24.0)
Bicarbonate: 23.6 mEq/L (ref 20.0–24.0)
Bicarbonate: 23.9 mEq/L (ref 20.0–24.0)
Bicarbonate: 24.6 mEq/L — ABNORMAL HIGH (ref 20.0–24.0)
Bicarbonate: 24.7 mEq/L — ABNORMAL HIGH (ref 20.0–24.0)
Bicarbonate: 24.9 mEq/L — ABNORMAL HIGH (ref 20.0–24.0)
Bicarbonate: 25.3 mEq/L — ABNORMAL HIGH (ref 20.0–24.0)
Bicarbonate: 25.6 mEq/L — ABNORMAL HIGH (ref 20.0–24.0)
Bicarbonate: 26.3 mEq/L — ABNORMAL HIGH (ref 20.0–24.0)
Bicarbonate: 27.4 mEq/L — ABNORMAL HIGH (ref 20.0–24.0)
Bicarbonate: 28.7 mEq/L — ABNORMAL HIGH (ref 20.0–24.0)
Bicarbonate: 32.7 mEq/L — ABNORMAL HIGH (ref 20.0–24.0)
O2 Saturation: 78 %
O2 Saturation: 85 %
O2 Saturation: 89 %
O2 Saturation: 90 %
O2 Saturation: 91 %
O2 Saturation: 91 %
O2 Saturation: 92 %
O2 Saturation: 93 %
O2 Saturation: 93 %
O2 Saturation: 95 %
O2 Saturation: 95 %
O2 Saturation: 98 %
O2 Saturation: 99 %
Patient temperature: 100.5
Patient temperature: 101.2
Patient temperature: 101.6
Patient temperature: 101.6
Patient temperature: 97.3
Patient temperature: 97.4
Patient temperature: 97.4
Patient temperature: 97.5
Patient temperature: 98
Patient temperature: 98
Patient temperature: 98
Patient temperature: 98.2
Patient temperature: 99.2
Patient temperature: 99.2
Patient temperature: 99.9
TCO2: 23 mmol/L (ref 0–100)
TCO2: 23 mmol/L (ref 0–100)
TCO2: 24 mmol/L (ref 0–100)
TCO2: 24 mmol/L (ref 0–100)
TCO2: 27 mmol/L (ref 0–100)
TCO2: 27 mmol/L (ref 0–100)
TCO2: 28 mmol/L (ref 0–100)
TCO2: 28 mmol/L (ref 0–100)
TCO2: 29 mmol/L (ref 0–100)
TCO2: 29 mmol/L (ref 0–100)
TCO2: 29 mmol/L (ref 0–100)
TCO2: 29 mmol/L (ref 0–100)
TCO2: 31 mmol/L (ref 0–100)
TCO2: 35 mmol/L (ref 0–100)
pCO2 arterial: 42 mmHg (ref 35.0–45.0)
pCO2 arterial: 42.1 mmHg (ref 35.0–45.0)
pCO2 arterial: 43.7 mmHg (ref 35.0–45.0)
pCO2 arterial: 47.8 mmHg — ABNORMAL HIGH (ref 35.0–45.0)
pCO2 arterial: 48.6 mmHg — ABNORMAL HIGH (ref 35.0–45.0)
pCO2 arterial: 54.1 mmHg — ABNORMAL HIGH (ref 35.0–45.0)
pCO2 arterial: 54.3 mmHg — ABNORMAL HIGH (ref 35.0–45.0)
pCO2 arterial: 55.7 mmHg — ABNORMAL HIGH (ref 35.0–45.0)
pCO2 arterial: 59.4 mmHg (ref 35.0–45.0)
pCO2 arterial: 59.9 mmHg (ref 35.0–45.0)
pCO2 arterial: 60.5 mmHg (ref 35.0–45.0)
pCO2 arterial: 62 mmHg (ref 35.0–45.0)
pCO2 arterial: 66.4 mmHg (ref 35.0–45.0)
pCO2 arterial: 67.7 mmHg (ref 35.0–45.0)
pCO2 arterial: 83.1 mmHg (ref 35.0–45.0)
pCO2 arterial: 84.6 mmHg (ref 35.0–45.0)
pH, Arterial: 7.087 — CL (ref 7.350–7.450)
pH, Arterial: 7.134 — CL (ref 7.350–7.450)
pH, Arterial: 7.167 — CL (ref 7.350–7.450)
pH, Arterial: 7.168 — CL (ref 7.350–7.450)
pH, Arterial: 7.197 — CL (ref 7.350–7.450)
pH, Arterial: 7.198 — CL (ref 7.350–7.450)
pH, Arterial: 7.207 — ABNORMAL LOW (ref 7.350–7.450)
pH, Arterial: 7.217 — ABNORMAL LOW (ref 7.350–7.450)
pH, Arterial: 7.24 — ABNORMAL LOW (ref 7.350–7.450)
pH, Arterial: 7.262 — ABNORMAL LOW (ref 7.350–7.450)
pH, Arterial: 7.294 — ABNORMAL LOW (ref 7.350–7.450)
pH, Arterial: 7.306 — ABNORMAL LOW (ref 7.350–7.450)
pH, Arterial: 7.311 — ABNORMAL LOW (ref 7.350–7.450)
pH, Arterial: 7.325 — ABNORMAL LOW (ref 7.350–7.450)
pH, Arterial: 7.331 — ABNORMAL LOW (ref 7.350–7.450)
pH, Arterial: 7.331 — ABNORMAL LOW (ref 7.350–7.450)
pH, Arterial: 7.331 — ABNORMAL LOW (ref 7.350–7.450)
pH, Arterial: 7.342 — ABNORMAL LOW (ref 7.350–7.450)
pH, Arterial: 7.375 (ref 7.350–7.450)
pH, Arterial: 7.381 (ref 7.350–7.450)
pO2, Arterial: 101 mmHg — ABNORMAL HIGH (ref 80.0–100.0)
pO2, Arterial: 101 mmHg — ABNORMAL HIGH (ref 80.0–100.0)
pO2, Arterial: 102 mmHg — ABNORMAL HIGH (ref 80.0–100.0)
pO2, Arterial: 108 mmHg — ABNORMAL HIGH (ref 80.0–100.0)
pO2, Arterial: 113 mmHg — ABNORMAL HIGH (ref 80.0–100.0)
pO2, Arterial: 118 mmHg — ABNORMAL HIGH (ref 80.0–100.0)
pO2, Arterial: 122 mmHg — ABNORMAL HIGH (ref 80.0–100.0)
pO2, Arterial: 40 mmHg — ABNORMAL LOW (ref 80.0–100.0)
pO2, Arterial: 55 mmHg — ABNORMAL LOW (ref 80.0–100.0)
pO2, Arterial: 60 mmHg — ABNORMAL LOW (ref 80.0–100.0)
pO2, Arterial: 66 mmHg — ABNORMAL LOW (ref 80.0–100.0)
pO2, Arterial: 66 mmHg — ABNORMAL LOW (ref 80.0–100.0)
pO2, Arterial: 70 mmHg — ABNORMAL LOW (ref 80.0–100.0)
pO2, Arterial: 70 mmHg — ABNORMAL LOW (ref 80.0–100.0)
pO2, Arterial: 77 mmHg — ABNORMAL LOW (ref 80.0–100.0)
pO2, Arterial: 81 mmHg (ref 80.0–100.0)
pO2, Arterial: 81 mmHg (ref 80.0–100.0)
pO2, Arterial: 85 mmHg (ref 80.0–100.0)

## 2010-05-20 LAB — CROSSMATCH: Antibody Screen: NEGATIVE

## 2010-05-20 LAB — COMPREHENSIVE METABOLIC PANEL
ALT: 279 U/L — ABNORMAL HIGH (ref 0–53)
ALT: 599 U/L — ABNORMAL HIGH (ref 0–53)
ALT: 664 U/L — ABNORMAL HIGH (ref 0–53)
ALT: 84 U/L — ABNORMAL HIGH (ref 0–53)
ALT: 882 U/L — ABNORMAL HIGH (ref 0–53)
AST: 186 U/L — ABNORMAL HIGH (ref 0–37)
AST: 209 U/L — ABNORMAL HIGH (ref 0–37)
AST: 47 U/L — ABNORMAL HIGH (ref 0–37)
AST: 792 U/L — ABNORMAL HIGH (ref 0–37)
Albumin: 1.6 g/dL — ABNORMAL LOW (ref 3.5–5.2)
Albumin: 1.6 g/dL — ABNORMAL LOW (ref 3.5–5.2)
Albumin: 1.7 g/dL — ABNORMAL LOW (ref 3.5–5.2)
Albumin: 2.3 g/dL — ABNORMAL LOW (ref 3.5–5.2)
Albumin: 3.5 g/dL (ref 3.5–5.2)
Alkaline Phosphatase: 120 U/L — ABNORMAL HIGH (ref 39–117)
BUN: 102 mg/dL — ABNORMAL HIGH (ref 6–23)
BUN: 25 mg/dL — ABNORMAL HIGH (ref 6–23)
BUN: 12 mg/dL (ref 6–23)
CO2: 25 mEq/L (ref 19–32)
CO2: 25 mEq/L (ref 19–32)
CO2: 21 mEq/L (ref 19–32)
Calcium: 7.3 mg/dL — ABNORMAL LOW (ref 8.4–10.5)
Calcium: 7.7 mg/dL — ABNORMAL LOW (ref 8.4–10.5)
Calcium: 7.7 mg/dL — ABNORMAL LOW (ref 8.4–10.5)
Calcium: 7.8 mg/dL — ABNORMAL LOW (ref 8.4–10.5)
Calcium: 8 mg/dL — ABNORMAL LOW (ref 8.4–10.5)
Calcium: 8.7 mg/dL (ref 8.4–10.5)
Chloride: 106 mEq/L (ref 96–112)
Chloride: 109 mEq/L (ref 96–112)
Chloride: 111 mEq/L (ref 96–112)
Chloride: 112 mEq/L (ref 96–112)
Creatinine, Ser: 1.38 mg/dL (ref 0.4–1.5)
Creatinine, Ser: 1.68 mg/dL — ABNORMAL HIGH (ref 0.4–1.5)
Creatinine, Ser: 3.97 mg/dL — ABNORMAL HIGH (ref 0.4–1.5)
Creatinine, Ser: 6.16 mg/dL — ABNORMAL HIGH (ref 0.4–1.5)
Creatinine, Ser: 1.51 mg/dL — ABNORMAL HIGH (ref 0.4–1.5)
GFR calc Af Amer: 12 mL/min — ABNORMAL LOW (ref >60)
GFR calc Af Amer: 20 mL/min — ABNORMAL LOW (ref >60)
GFR calc Af Amer: 44 mL/min — ABNORMAL LOW (ref >60)
GFR calc Af Amer: 54 mL/min — ABNORMAL LOW (ref >60)
GFR calc Af Amer: >60 mL/min (ref >60)
GFR calc Af Amer: >60 mL/min (ref >60)
GFR calc non Af Amer: 17 mL/min — ABNORMAL LOW (ref >60)
GFR calc non Af Amer: 45 mL/min — ABNORMAL LOW (ref >60)
GFR calc non Af Amer: 51 mL/min — ABNORMAL LOW (ref >60)
Glucose, Bld: 107 mg/dL — ABNORMAL HIGH (ref 70–99)
Glucose, Bld: 118 mg/dL — ABNORMAL HIGH (ref 70–99)
Glucose, Bld: 160 mg/dL — ABNORMAL HIGH (ref 70–99)
Glucose, Bld: 192 mg/dL — ABNORMAL HIGH (ref 70–99)
Potassium: 4.7 mEq/L (ref 3.5–5.1)
Potassium: 4.9 mEq/L (ref 3.5–5.1)
Sodium: 136 mEq/L (ref 135–145)
Sodium: 137 mEq/L (ref 135–145)
Sodium: 138 mEq/L (ref 135–145)
Sodium: 138 mEq/L (ref 135–145)
Sodium: 141 mEq/L (ref 135–145)
Total Bilirubin: 1.1 mg/dL (ref 0.3–1.2)
Total Bilirubin: 1.1 mg/dL (ref 0.3–1.2)
Total Bilirubin: 1.2 mg/dL (ref 0.3–1.2)
Total Bilirubin: 0.3 mg/dL (ref 0.3–1.2)
Total Protein: 4.8 g/dL — ABNORMAL LOW (ref 6.0–8.3)
Total Protein: 5.3 g/dL — ABNORMAL LOW (ref 6.0–8.3)
Total Protein: 6 g/dL (ref 6.0–8.3)
Total Protein: 6.3 g/dL (ref 6.0–8.3)
Total Protein: 6.5 g/dL (ref 6.0–8.3)

## 2010-05-20 LAB — BASIC METABOLIC PANEL
BUN: 106 mg/dL — ABNORMAL HIGH (ref 6–23)
BUN: 21 mg/dL (ref 6–23)
BUN: 28 mg/dL — ABNORMAL HIGH (ref 6–23)
BUN: 65 mg/dL — ABNORMAL HIGH (ref 6–23)
CO2: 23 mEq/L (ref 19–32)
CO2: 23 mEq/L (ref 19–32)
CO2: 25 mEq/L (ref 19–32)
CO2: 25 mEq/L (ref 19–32)
CO2: 27 mEq/L (ref 19–32)
CO2: 27 mEq/L (ref 19–32)
Calcium: 7.1 mg/dL — ABNORMAL LOW (ref 8.4–10.5)
Calcium: 7.4 mg/dL — ABNORMAL LOW (ref 8.4–10.5)
Calcium: 7.7 mg/dL — ABNORMAL LOW (ref 8.4–10.5)
Calcium: 7.8 mg/dL — ABNORMAL LOW (ref 8.4–10.5)
Calcium: 7.9 mg/dL — ABNORMAL LOW (ref 8.4–10.5)
Calcium: 7.5 mg/dL — ABNORMAL LOW (ref 8.4–10.5)
Chloride: 106 mEq/L (ref 96–112)
Chloride: 111 mEq/L (ref 96–112)
Chloride: 112 mEq/L (ref 96–112)
Chloride: 113 mEq/L — ABNORMAL HIGH (ref 96–112)
Creatinine, Ser: 1.06 mg/dL (ref 0.4–1.5)
Creatinine, Ser: 1.18 mg/dL (ref 0.4–1.5)
Creatinine, Ser: 1.2 mg/dL (ref 0.4–1.5)
Creatinine, Ser: 1.34 mg/dL (ref 0.4–1.5)
Creatinine, Ser: 1.47 mg/dL (ref 0.4–1.5)
Creatinine, Ser: 1.48 mg/dL (ref 0.4–1.5)
Creatinine, Ser: 2.54 mg/dL — ABNORMAL HIGH (ref 0.4–1.5)
Creatinine, Ser: 4.64 mg/dL — ABNORMAL HIGH (ref 0.4–1.5)
Creatinine, Ser: 6.34 mg/dL — ABNORMAL HIGH (ref 0.4–1.5)
GFR calc Af Amer: >60 mL/min (ref >60)
GFR calc Af Amer: >60 mL/min (ref >60)
GFR calc Af Amer: >60 mL/min (ref >60)
GFR calc Af Amer: >60 mL/min (ref >60)
GFR calc Af Amer: >60 mL/min (ref >60)
GFR calc non Af Amer: 10 mL/min — ABNORMAL LOW (ref >60)
GFR calc non Af Amer: 38 mL/min — ABNORMAL LOW (ref >60)
GFR calc non Af Amer: >60 mL/min (ref >60)
GFR calc non Af Amer: 57 mL/min — ABNORMAL LOW (ref >60)
Glucose, Bld: 101 mg/dL — ABNORMAL HIGH (ref 70–99)
Glucose, Bld: 117 mg/dL — ABNORMAL HIGH (ref 70–99)
Glucose, Bld: 131 mg/dL — ABNORMAL HIGH (ref 70–99)
Glucose, Bld: 135 mg/dL — ABNORMAL HIGH (ref 70–99)
Glucose, Bld: 135 mg/dL — ABNORMAL HIGH (ref 70–99)
Glucose, Bld: 138 mg/dL — ABNORMAL HIGH (ref 70–99)
Potassium: 3.3 mEq/L — ABNORMAL LOW (ref 3.5–5.1)
Potassium: 3.6 mEq/L (ref 3.5–5.1)
Potassium: 4.4 mEq/L (ref 3.5–5.1)
Potassium: 3.6 mEq/L (ref 3.5–5.1)
Sodium: 131 mEq/L — ABNORMAL LOW (ref 135–145)
Sodium: 138 mEq/L (ref 135–145)

## 2010-05-20 LAB — URINE MICROSCOPIC-ADD ON

## 2010-05-20 LAB — RENAL FUNCTION PANEL
Albumin: 1.5 g/dL — ABNORMAL LOW (ref 3.5–5.2)
Albumin: 1.7 g/dL — ABNORMAL LOW (ref 3.5–5.2)
BUN: 105 mg/dL — ABNORMAL HIGH (ref 6–23)
BUN: 69 mg/dL — ABNORMAL HIGH (ref 6–23)
BUN: 85 mg/dL — ABNORMAL HIGH (ref 6–23)
BUN: 98 mg/dL — ABNORMAL HIGH (ref 6–23)
CO2: 22 mEq/L (ref 19–32)
CO2: 27 mEq/L (ref 19–32)
Calcium: 6.5 mg/dL — ABNORMAL LOW (ref 8.4–10.5)
Calcium: 7 mg/dL — ABNORMAL LOW (ref 8.4–10.5)
Calcium: 7.7 mg/dL — ABNORMAL LOW (ref 8.4–10.5)
Calcium: 7.7 mg/dL — ABNORMAL LOW (ref 8.4–10.5)
Chloride: 117 mEq/L — ABNORMAL HIGH (ref 96–112)
Chloride: 98 mEq/L (ref 96–112)
Creatinine, Ser: 4.1 mg/dL — ABNORMAL HIGH (ref 0.4–1.5)
Creatinine, Ser: 5.49 mg/dL — ABNORMAL HIGH (ref 0.4–1.5)
Creatinine, Ser: 6.32 mg/dL — ABNORMAL HIGH (ref 0.4–1.5)
GFR calc Af Amer: 14 mL/min — ABNORMAL LOW (ref >60)
GFR calc Af Amer: 19 mL/min — ABNORMAL LOW (ref >60)
GFR calc Af Amer: 21 mL/min — ABNORMAL LOW (ref >60)
GFR calc non Af Amer: 11 mL/min — ABNORMAL LOW (ref >60)
GFR calc non Af Amer: 16 mL/min — ABNORMAL LOW (ref >60)
GFR calc non Af Amer: 18 mL/min — ABNORMAL LOW (ref >60)
Glucose, Bld: 135 mg/dL — ABNORMAL HIGH (ref 70–99)
Glucose, Bld: 150 mg/dL — ABNORMAL HIGH (ref 70–99)
Phosphorus: 3.8 mg/dL (ref 2.3–4.6)
Phosphorus: 5.4 mg/dL — ABNORMAL HIGH (ref 2.3–4.6)
Phosphorus: 5.8 mg/dL — ABNORMAL HIGH (ref 2.3–4.6)
Potassium: 3.8 mEq/L (ref 3.5–5.1)
Sodium: 134 mEq/L — ABNORMAL LOW (ref 135–145)
Sodium: 139 mEq/L (ref 135–145)
Sodium: 140 mEq/L (ref 135–145)

## 2010-05-20 LAB — ABO/RH: ABO/RH(D): O POS

## 2010-05-20 LAB — CULTURE, BLOOD (ROUTINE X 2)
Culture: NO GROWTH
Culture: NO GROWTH

## 2010-05-20 LAB — URINALYSIS, ROUTINE W REFLEX MICROSCOPIC
Glucose, UA: NEGATIVE mg/dL
Ketones, ur: NEGATIVE mg/dL
Nitrite: POSITIVE — AB
Protein, ur: 100 mg/dL — AB
Specific Gravity, Urine: 1.021 (ref 1.005–1.030)
Specific Gravity, Urine: 1.024 (ref 1.005–1.030)
Specific Gravity, Urine: >1.046 — ABNORMAL HIGH (ref 1.005–1.030)
Urobilinogen, UA: 0.2 mg/dL (ref 0.0–1.0)
pH: 5 (ref 5.0–8.0)
pH: 5.5 (ref 5.0–8.0)
pH: 6 (ref 5.0–8.0)

## 2010-05-20 LAB — MAGNESIUM
Magnesium: 2.3 mg/dL (ref 1.5–2.5)
Magnesium: 2.4 mg/dL (ref 1.5–2.5)
Magnesium: 2.5 mg/dL (ref 1.5–2.5)
Magnesium: 2.6 mg/dL — ABNORMAL HIGH (ref 1.5–2.5)

## 2010-05-20 LAB — URINE CULTURE: Culture: NO GROWTH

## 2010-05-20 LAB — PROTIME-INR
INR: 1.35 (ref 0.00–1.49)
INR: 1.12 (ref 0.00–1.49)
INR: 1.27 (ref 0.00–1.49)

## 2010-05-20 LAB — FUNGUS CULTURE W SMEAR

## 2010-05-20 LAB — TYPE AND SCREEN

## 2010-05-20 LAB — IRON AND TIBC
Iron: 24 ug/dL — ABNORMAL LOW (ref 42–135)
TIBC: 217 ug/dL (ref 215–435)

## 2010-05-20 LAB — CULTURE, BAL-QUANTITATIVE W GRAM STAIN
Colony Count: 1000
Colony Count: 4000

## 2010-05-20 LAB — PHOSPHORUS
Phosphorus: 2.9 mg/dL (ref 2.3–4.6)
Phosphorus: 4.4 mg/dL (ref 2.3–4.6)
Phosphorus: 5 mg/dL — ABNORMAL HIGH (ref 2.3–4.6)
Phosphorus: 5.1 mg/dL — ABNORMAL HIGH (ref 2.3–4.6)

## 2010-05-20 LAB — MRSA PCR SCREENING
MRSA by PCR: NEGATIVE
MRSA by PCR: NEGATIVE

## 2010-05-20 LAB — BRAIN NATRIURETIC PEPTIDE: Pro B Natriuretic peptide (BNP): 57 pg/mL (ref 0.0–100.0)

## 2010-05-20 LAB — BODY FLUID CULTURE: Culture: NO GROWTH

## 2010-05-20 LAB — POCT I-STAT, CHEM 8
Chloride: 109 mEq/L (ref 96–112)
HCT: 29 % — ABNORMAL LOW (ref 39.0–52.0)
Potassium: 4.8 mEq/L (ref 3.5–5.1)
Sodium: 140 mEq/L (ref 135–145)

## 2010-05-20 LAB — LACTIC ACID, PLASMA
Lactic Acid, Venous: 0.7 mmol/L (ref 0.5–2.2)
Lactic Acid, Venous: 1.3 mmol/L (ref 0.5–2.2)

## 2010-05-20 LAB — CARDIAC PANEL(CRET KIN+CKTOT+MB+TROPI)
CK, MB: 3.5 ng/mL (ref 0.3–4.0)
Relative Index: 0.1 (ref 0.0–2.5)
Total CK: 1189 U/L — ABNORMAL HIGH (ref 7–232)
Troponin I: 0.16 ng/mL — ABNORMAL HIGH (ref 0.00–0.06)

## 2010-05-20 LAB — NO BLOOD PRODUCTS

## 2010-05-20 LAB — LIPASE, BLOOD: Lipase: 56 U/L (ref 11–59)

## 2010-05-20 LAB — PREALBUMIN
Prealbumin: 15.6 mg/dL — ABNORMAL LOW (ref 18.0–45.0)
Prealbumin: 6.2 mg/dL — ABNORMAL LOW (ref 18.0–45.0)

## 2010-05-20 LAB — APTT: aPTT: 26 seconds (ref 24–37)

## 2010-05-20 LAB — AFB CULTURE WITH SMEAR (NOT AT ARMC)

## 2010-05-20 LAB — GRAM STAIN

## 2010-05-20 LAB — CHOLESTEROL, TOTAL

## 2010-05-20 LAB — TRIGLYCERIDES: Triglycerides: 276 mg/dL — ABNORMAL HIGH (ref <150)

## 2010-05-20 LAB — POTASSIUM: Potassium: 5.8 mEq/L — ABNORMAL HIGH (ref 3.5–5.1)
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
## 2011-10-25 ENCOUNTER — Emergency Department: Payer: Self-pay | Admitting: Emergency Medicine

## 2015-08-15 ENCOUNTER — Encounter: Payer: Self-pay | Admitting: Emergency Medicine

## 2015-08-15 ENCOUNTER — Emergency Department: Payer: Self-pay

## 2015-08-15 ENCOUNTER — Emergency Department
Admission: EM | Admit: 2015-08-15 | Discharge: 2015-08-15 | Disposition: A | Discharge status: Home / Self Care | Payer: Self-pay | Attending: Emergency Medicine | Admitting: Emergency Medicine

## 2015-08-15 DIAGNOSIS — Y999 Unspecified external cause status: Secondary | ICD-10-CM | POA: Insufficient documentation

## 2015-08-15 DIAGNOSIS — S91331A Puncture wound without foreign body, right foot, initial encounter: Secondary | ICD-10-CM | POA: Insufficient documentation

## 2015-08-15 DIAGNOSIS — F17200 Nicotine dependence, unspecified, uncomplicated: Secondary | ICD-10-CM | POA: Insufficient documentation

## 2015-08-15 DIAGNOSIS — Y929 Unspecified place or not applicable: Secondary | ICD-10-CM | POA: Insufficient documentation

## 2015-08-15 DIAGNOSIS — Y939 Activity, unspecified: Secondary | ICD-10-CM | POA: Insufficient documentation

## 2015-08-15 DIAGNOSIS — W450XXA Nail entering through skin, initial encounter: Secondary | ICD-10-CM | POA: Insufficient documentation

## 2015-08-15 MED ORDER — CEPHALEXIN 500 MG PO CAPS: 500.0000 mg | ORAL_CAPSULE | Freq: Four times a day (QID) | ORAL | Status: AC

## 2015-08-15 MED ORDER — TETANUS-DIPHTH-ACELL PERTUSSIS 5-2.5-18.5 LF-MCG/0.5 IM SUSP
0.5000 mL | Freq: Once | INTRAMUSCULAR | Status: AC
  Administered 2015-08-15: 0.5 mL via INTRAMUSCULAR
  Filled 2015-08-15: qty 0.5

## 2015-08-15 MED ORDER — TETANUS-DIPHTH-ACELL PERTUSSIS 5-2.5-18.5 LF-MCG/0.5 IM SUSP
INTRAMUSCULAR | Status: AC
  Administered 2015-08-15: 0.5 mL via INTRAMUSCULAR
  Filled 2015-08-15: qty 0.5

## 2015-08-15 NOTE — ED Notes (Signed)
States he stepped on a nail about 1 week ago  conts to have pain to bottom of foot

## 2015-08-15 NOTE — ED Provider Notes (Signed)
[facility] Emergency Department Provider Note  ____________________________________________  Time seen: Approximately 5:33 PM  I have reviewed the triage vital signs and the nursing notes.   HISTORY  Chief Complaint Puncture Wound    HPI Albert Garrett is a 50 y.o. male who presents emergency department complaining of pain to the bottom of the right foot. Patient states that he stepped on a resting now approximately one week ago. Patient reports that he is having pain to the plantar aspect of the foot in the "ball the foot." Patient denies any erythema, edema, streaking to the area. He denies fevers or chills. Denies any other complaint. Patient is unsure of last tetanus shot.   History reviewed. No pertinent past medical history.  There are no active problems to display for this patient.   History reviewed. No pertinent past surgical history.  Current Outpatient Rx  Name  Route  Sig  Dispense  Refill  . cephALEXin (KEFLEX) 500 MG capsule   Oral   Take 1 capsule (500 mg total) by mouth 4 (four) times daily.   28 capsule   0     Allergies Review of patient's allergies indicates no known allergies.  No family history on file.  Social History Social History  Substance Use Topics  . Smoking status: Current Every Day Smoker  . Smokeless tobacco: None  . Alcohol Use: No     Review of Systems  Constitutional: No fever/chills Cardiovascular: no chest pain. Respiratory: no cough. No SOB. Musculoskeletal: Positive for right foot pain Skin: Negative for rash, abrasions, lacerations, ecchymosis. Neurological: Negative for headaches, focal weakness or numbness. 10-point ROS otherwise negative.  ____________________________________________   PHYSICAL EXAM:  VITAL SIGNS: ED Triage Vitals  Enc Vitals Group     BP --      Pulse --      Resp --      Temp --      Temp src --      SpO2 --      Weight --      Height --      Head Cir  --      Peak Flow --      Pain Score --      Pain Loc --      Pain Edu? --      Excl. in GC? --      Constitutional: Alert and oriented. Well appearing and in no acute distress. Eyes: Conjunctivae are normal. PERRL. EOMI. Head: Atraumatic. Cardiovascular: Normal rate, regular rhythm. Normal S1 and S2.  Good peripheral circulation. Respiratory: Normal respiratory effort without tachypnea or retractions. Lungs CTAB. Good air entry to the bases with no decreased or absent breath sounds. Musculoskeletal: Full range of motion to all extremities. No gross deformities appreciated. Full range of motion to right ankle and all digits right foot. Small puncture wound is noted just proximal to the MTP joint of the second digit on the plantar aspect of the foot. No surrounding edema. Area with some erythema. Area is tender palpation. No firmness to palpation. No palpable abnormality. No visible foreign body. Neurologic:  Normal speech and language. No gross focal neurologic deficits are appreciated.  Skin:  Skin is warm, dry and intact. No rash noted. Psychiatric: Mood and affect are normal. Speech and behavior are normal. Patient exhibits appropriate insight and judgement.   ____________________________________________   LABS (all labs ordered are listed, but only abnormal results are displayed)  Labs Reviewed - No data to  display ____________________________________________  EKG   ____________________________________________  RADIOLOGY Festus BarrenI, Jonathan D Cuthriell, personally viewed and evaluated these images (plain radiographs) as part of my medical decision making, as well as reviewing the written report by the radiologist.  Dg Foot Complete Right  08/15/2015  CLINICAL DATA:  Stepped on a rusty nail 1 week ago. Wound is at the second metatarsal head along the plantar surface. EXAM: RIGHT FOOT COMPLETE - 3+ VIEW COMPARISON:  None. FINDINGS: There is no evidence of fracture or dislocation. There  is no evidence of arthropathy or other focal bone abnormality. Soft tissues are unremarkable. IMPRESSION: No fracture, dislocation, or foreign body identified. Electronically Signed   By: Gerome Samavid  Williams III M.D   On: 08/15/2015 18:09    ____________________________________________    PROCEDURES  Procedure(s) performed:       Medications  Tdap (BOOSTRIX) injection 0.5 mL (0.5 mLs Intramuscular Given 08/15/15 1749)     ____________________________________________   INITIAL IMPRESSION / ASSESSMENT AND PLAN / ED COURSE  Pertinent labs & imaging results that were available during my care of the patient were reviewed by me and considered in my medical decision making (see chart for details).  Patient's diagnosis is consistent with Puncture wound to right foot. X-ray reveals no retained foreign body. Exam is reassuring. There is minor erythema in the area and patient will be covered with antibiotics to ensure no infection.. Patient will be discharged home with prescriptions for Keflex. Patient is to follow up with primary care provider as needed or otherwise directed. Patient is given ED precautions to return to the ED for any worsening or new symptoms.     ____________________________________________  FINAL CLINICAL IMPRESSION(S) / ED DIAGNOSES  Final diagnoses:  Puncture wound of foot, right, initial encounter      NEW MEDICATIONS STARTED DURING THIS VISIT:  New Prescriptions   CEPHALEXIN (KEFLEX) 500 MG CAPSULE    Take 1 capsule (500 mg total) by mouth 4 (four) times daily.        This chart was dictated using voice recognition software/Dragon. Despite best efforts to proofread, errors can occur which can change the meaning. Any change was purely unintentional.    Racheal PatchesJonathan D Cuthriell, PA-C 08/15/15 1824  Phineas SemenGraydon Goodman, MD 08/15/15 2038

## 2015-08-15 NOTE — Discharge Instructions (Signed)
Puncture Wound A puncture wound is an injury that is caused by a sharp, thin object that goes through (penetrates) your skin. Usually, a puncture wound does not leave a large opening in your skin, so it may not bleed a lot. However, when you get a puncture wound, dirt or other materials (foreign bodies) can be forced into your wound and break off inside. This increases the chance of infection, such as tetanus. CAUSES Puncture wounds are caused by any sharp, thin object that goes through your skin, such as:  Animal teeth, as with an animal bite.  Sharp, pointed objects, such as nails, splinters of glass, fishhooks, and needles. SYMPTOMS Symptoms of a puncture wound include:  Pain.  Bleeding.  Swelling.  Bruising.  Fluid leaking from the wound.  Numbness, tingling, or loss of function. DIAGNOSIS This condition is diagnosed with a medical history and physical exam. Your wound will be checked to see if it contains any foreign bodies. You may also have X-rays or other imaging tests. TREATMENT Treatment for a puncture wound depends on how serious the wound is. It also depends on whether the wound contains any foreign bodies. Treatment for all types of puncture wounds usually starts with:  Controlling the bleeding.  Washing out the wound with a germ-free (sterile) salt-water solution.  Checking the wound for foreign bodies. Treatment may also include:  Having the wound opened surgically to remove a foreign object.  Closing the wound with stitches (sutures) if it continues to bleed.  Covering the wound with antibiotic ointments and a bandage (dressing).  Receiving a tetanus shot.  Receiving a rabies vaccine. HOME CARE INSTRUCTIONS Medicines  Take or apply over-the-counter and prescription medicines only as told by your health care provider.  If you were prescribed an antibiotic, take or apply it as told by your health care provider. Do not stop using the antibiotic even if  your condition improves. Wound Care  There are many ways to close and cover a wound. For example, a wound can be covered with sutures, skin glue, or adhesive strips. Follow instructions from your health care provider about:  How to take care of your wound.  When and how you should change your dressing.  When you should remove your dressing.  Removing whatever was used to close your wound.  Keep the dressing dry as told by your health care provider. Do not take baths, swim, use a hot tub, or do anything that would put your wound underwater until your health care provider approves.  Clean the wound as told by your health care provider.  Do not scratch or pick at the wound.  Check your wound every day for signs of infection. Watch for:  Redness, swelling, or pain.  Fluid, blood, or pus. General Instructions  Raise (elevate) the injured area above the level of your heart while you are sitting or lying down.  If your puncture wound is in your foot, ask your health care provider if you need to avoid putting weight on your foot and for how long.  Keep all follow-up visits as told by your health care provider. This is important. SEEK MEDICAL CARE IF:  You received a tetanus shot and you have swelling, severe pain, redness, or bleeding at the injection site.  You have a fever.  Your sutures come out.  You notice a bad smell coming from your wound or your dressing.  You notice something coming out of your wound, such as wood or glass.  Your   pain is not controlled with medicine.  You have increased redness, swelling, or pain at the site of your wound.  You have fluid, blood, or pus coming from your wound.  You notice a change in the color of your skin near your wound.  You need to change the dressing frequently due to fluid, blood, or pus draining from your wound.  You develop a new rash.  You develop numbness around your wound. SEEK IMMEDIATE MEDICAL CARE IF:  You  develop severe swelling around your wound.  Your pain suddenly increases and is severe.  You develop painful skin lumps.  You have a red streak going away from your wound.  The wound is on your hand or foot and you cannot properly move a finger or toe.  The wound is on your hand or foot and you notice that your fingers or toes look pale or bluish.   This information is not intended to replace advice given to you by your health care provider. Make sure you discuss any questions you have with your health care provider.   Document Released: 11/11/2004 Document Revised: 10/23/2014 Document Reviewed: 03/27/2014 Elsevier Interactive Patient Education 2016 [employer]  

## 2019-05-12 ENCOUNTER — Encounter (HOSPITAL_COMMUNITY): Payer: Self-pay | Admitting: Emergency Medicine

## 2019-05-12 ENCOUNTER — Emergency Department (HOSPITAL_COMMUNITY)
Admission: EM | Admit: 2019-05-12 | Discharge: 2019-05-12 | Disposition: A | Discharge status: Home / Self Care | Payer: Self-pay | Attending: Emergency Medicine | Admitting: Emergency Medicine

## 2019-05-12 ENCOUNTER — Other Ambulatory Visit: Payer: Self-pay

## 2019-05-12 DIAGNOSIS — Z79899 Other long term (current) drug therapy: Secondary | ICD-10-CM | POA: Insufficient documentation

## 2019-05-12 DIAGNOSIS — F17210 Nicotine dependence, cigarettes, uncomplicated: Secondary | ICD-10-CM | POA: Insufficient documentation

## 2019-05-12 DIAGNOSIS — Y999 Unspecified external cause status: Secondary | ICD-10-CM | POA: Insufficient documentation

## 2019-05-12 DIAGNOSIS — Y92410 Unspecified street and highway as the place of occurrence of the external cause: Secondary | ICD-10-CM | POA: Insufficient documentation

## 2019-05-12 DIAGNOSIS — M542 Cervicalgia: Secondary | ICD-10-CM | POA: Insufficient documentation

## 2019-05-12 DIAGNOSIS — Y93I9 Activity, other involving external motion: Secondary | ICD-10-CM | POA: Insufficient documentation

## 2019-05-12 MED ORDER — METHOCARBAMOL 500 MG PO TABS: 500.0000 mg | ORAL_TABLET | Freq: Two times a day (BID) | ORAL | 0 refills | Status: DC

## 2019-05-12 MED ORDER — NAPROXEN 500 MG PO TABS: 500.0000 mg | ORAL_TABLET | Freq: Two times a day (BID) | ORAL | 0 refills | Status: DC
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
## 2019-05-12 NOTE — ED Notes (Signed)
Patient verbalizes understanding of discharge instructions. Opportunity for questioning and answers were provided. Pt discharged from ED. 
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
## 2019-10-21 ENCOUNTER — Emergency Department (HOSPITAL_COMMUNITY)
Admission: EM | Admit: 2019-10-21 | Discharge: 2019-10-21 | Disposition: A | Discharge status: Home / Self Care | Payer: Self-pay | Attending: Emergency Medicine | Admitting: Emergency Medicine

## 2019-10-21 ENCOUNTER — Emergency Department (HOSPITAL_COMMUNITY): Payer: Self-pay

## 2019-10-21 ENCOUNTER — Encounter (HOSPITAL_COMMUNITY): Payer: Self-pay

## 2019-10-21 ENCOUNTER — Other Ambulatory Visit: Payer: Self-pay

## 2019-10-21 DIAGNOSIS — F17200 Nicotine dependence, unspecified, uncomplicated: Secondary | ICD-10-CM | POA: Insufficient documentation

## 2019-10-21 DIAGNOSIS — M7041 Prepatellar bursitis, right knee: Secondary | ICD-10-CM | POA: Insufficient documentation

## 2019-10-21 DIAGNOSIS — Z79899 Other long term (current) drug therapy: Secondary | ICD-10-CM | POA: Insufficient documentation

## 2019-10-21 DIAGNOSIS — Y9389 Activity, other specified: Secondary | ICD-10-CM | POA: Insufficient documentation

## 2019-10-21 MED ORDER — NAPROXEN 500 MG PO TABS: 500.0000 mg | ORAL_TABLET | Freq: Two times a day (BID) | ORAL | 0 refills | Status: DC
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
## 2019-10-21 NOTE — Progress Notes (Signed)
Orthopedic Tech Progress Note Patient Details:  Albert Garrett [birth year] [id number]  Ortho Devices Type of Ortho Device: Knee Immobilizer Ortho Device/Splint Location: RLE Ortho Device/Splint Interventions: Ordered, Application, Adjustment   Post Interventions Patient Tolerated: Well, Ambulated well Instructions Provided: Care of device   Donald Pore 10/21/2019, 4:07 PM

## 2019-10-21 NOTE — Discharge Instructions (Signed)
Recommend ice to area for 20 minutes at a time. Take Naproxen as prescribed. Take with food.  Follow up with orthopedics, referral given.

## 2019-10-21 NOTE — ED Provider Notes (Signed)
[facility] EMERGENCY DEPARTMENT Provider Note   CSN: [id number] Arrival date & time: 10/21/19  8101     History No chief complaint on file.   Albert Garrett is a 54 y.o. male.  54 year old male presents with complaint of right knee pain.  Patient states that he knocked his knee on a cabinet 2 days ago, didn't think anything of the injury however woke up with significant pain to his anterior right knee, pain worse with flexion of the knee.  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  Patient has tried icing the knee without improvement.        History reviewed. No pertinent past medical history.  There are no problems to display for this patient.   History reviewed. No pertinent surgical history.     No family history on file.  Social History   Tobacco Use  . Smoking status: Current Every Day Smoker  Substance Use Topics  . Alcohol use: No  . Drug use: Not on file    Home Medications Prior to Admission medications   Medication Sig Start Date End Date Taking? Authorizing Provider  cephALEXin (KEFLEX) 500 MG capsule Take 1 capsule (500 mg total) by mouth 4 (four) times daily. 08/15/15   Cuthriell, Delorise Royals, PA-C  methocarbamol (ROBAXIN) 500 MG tablet Take 1 tablet (500 mg total) by mouth 2 (two) times daily. 05/12/19   Hyman Hopes, Margaux, PA-C  naproxen (NAPROSYN) 500 MG tablet Take 1 tablet (500 mg total) by mouth 2 (two) times daily. 10/21/19   Jeannie Fend, PA-C    Allergies    Patient has no known allergies.  Review of Systems   Review of Systems  Constitutional: Negative for fever.  Musculoskeletal: Positive for arthralgias and joint swelling.  Skin: Negative for rash and wound.  Allergic/Immunologic: Negative for immunocompromised state.  Neurological: Negative for weakness and numbness.    Physical Exam Updated Vital Signs BP (!) 157/94 (BP Location: Right Arm)   Pulse 65   Temp 98.6 F (37  C) (Oral)   Resp 14   SpO2 97%   Physical Exam Vitals and nursing note reviewed.  Constitutional:      General: He is not in acute distress.    Appearance: He is well-developed. He is not diaphoretic.  HENT:     Head: Normocephalic and atraumatic.  Cardiovascular:     Pulses: Normal pulses.  Pulmonary:     Effort: Pulmonary effort is normal.  Musculoskeletal:        General: Swelling and tenderness present. No deformity.     Comments: Pain with palpation of the anterior right knee, able to minimally flex the right knee.  Is able to fully extend. No ligamentous laxity or joint line tenderness.  Skin:    General: Skin is warm and dry.  Neurological:     Mental Status: He is alert and oriented to person, place, and time.     Sensory: No sensory deficit.     Motor: No weakness.  Psychiatric:        Behavior: Behavior normal.     ED Results / Procedures / Treatments   Labs (all labs ordered are listed, but only abnormal results are displayed) Labs Reviewed - No data to display  EKG None  Radiology DG Knee Complete 4 Views Right  Result Date: 10/21/2019 CLINICAL DATA:  Right knee pain for the past 2 days since hitting her knee  on the dresser. EXAM: RIGHT KNEE - COMPLETE 4+ VIEW COMPARISON:  None. FINDINGS: Prepatellar soft tissue swelling is present overlying the patella. No evidence of acute fracture or malalignment. No evidence of knee joint effusion. Enthesopathy is present at the superior patellar pole in the region of the quadriceps tendon insertion. IMPRESSION: 1. Prepatellar soft tissue swelling consistent with either contusion, or perhaps prepatellar bursitis. Additionally, there is enthesopathy (bone spur) at the superior patellar pole anteriorly at the insertion of the quadriceps tendon which may be an exacerbating factor. 2. No evidence of fracture or malalignment. Electronically Signed   By: Malachy Moan M.D.   On: 10/21/2019 10:09    Procedures Procedures  (including critical care time)  Medications Ordered in ED Medications - No data to display  ED Course  I have reviewed the triage vital signs and the nursing notes.  Pertinent labs & imaging results that were available during my care of the patient were reviewed by me and considered in my medical decision making (see chart for details).  Clinical Course as of Oct 21 1511  Sun Oct 21, 2019  3929 54 year old male with complaint of right knee pain x2 days.  Patient states that his knee on the cabinet at work but did not think anything of it.  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  On exam, has right knee swelling, skin is intact, no overlying erythema, pain mostly to the superior aspect of the patella.  Pain worse with straight leg raise and flexion of the knee. X-ray shows prepatellar bursitis.  Discussed with Dr. Hyacinth Meeker who is examined patient, recommends knee immobilizer, ice, NSAIDs and follow-up with orthopedics.   [LM]    Clinical Course User Index [LM] Alden Hipp   MDM Rules/Calculators/A&P                          Final Clinical Impression(s) / ED Diagnoses Final diagnoses:  Prepatellar bursitis of right knee    Rx / DC Orders ED Discharge Orders         Ordered    naproxen (NAPROSYN) 500 MG tablet  2 times daily        10/21/19 1511           Jeannie Fend, PA-C 10/21/19 1514    Eber Hong, MD 10/22/19 2010

## 2020-01-05 ENCOUNTER — Emergency Department (HOSPITAL_COMMUNITY): Payer: No Typology Code available for payment source

## 2020-01-05 ENCOUNTER — Emergency Department (HOSPITAL_COMMUNITY)
Admission: EM | Admit: 2020-01-05 | Discharge: 2020-01-05 | Disposition: A | Discharge status: Home / Self Care | Payer: No Typology Code available for payment source | Attending: Emergency Medicine | Admitting: Emergency Medicine

## 2020-01-05 ENCOUNTER — Other Ambulatory Visit: Payer: Self-pay

## 2020-01-05 ENCOUNTER — Encounter (HOSPITAL_COMMUNITY): Payer: Self-pay | Admitting: *Deleted

## 2020-01-05 DIAGNOSIS — Y92410 Unspecified street and highway as the place of occurrence of the external cause: Secondary | ICD-10-CM | POA: Diagnosis not present

## 2020-01-05 DIAGNOSIS — F17200 Nicotine dependence, unspecified, uncomplicated: Secondary | ICD-10-CM | POA: Diagnosis not present

## 2020-01-05 DIAGNOSIS — M542 Cervicalgia: Secondary | ICD-10-CM | POA: Diagnosis present

## 2020-01-05 MED ORDER — NAPROXEN 500 MG PO TABS: 500.0000 mg | ORAL_TABLET | Freq: Two times a day (BID) | ORAL | 0 refills | Status: AC

## 2020-01-05 MED ORDER — METHOCARBAMOL 500 MG PO TABS: 500.0000 mg | ORAL_TABLET | Freq: Two times a day (BID) | ORAL | 0 refills | Status: AC

## 2020-01-05 MED ORDER — IBUPROFEN 400 MG PO TABS
600.0000 mg | ORAL_TABLET | Freq: Once | ORAL | Status: AC
  Administered 2020-01-05: 600 mg via ORAL
  Filled 2020-01-05: qty 1
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
## 2020-01-05 NOTE — ED Notes (Signed)
Patient verbalizes understanding of discharge instructions. Opportunity for questioning and answers were provided. Arm band removed by staff, patient discharged from ED. 

## 2020-01-05 NOTE — Discharge Instructions (Signed)

## 2021-08-15 ENCOUNTER — Emergency Department: Payer: No Typology Code available for payment source

## 2021-08-15 ENCOUNTER — Other Ambulatory Visit: Payer: Self-pay

## 2021-08-15 ENCOUNTER — Emergency Department
Admission: EM | Admit: 2021-08-15 | Discharge: 2021-08-15 | Disposition: A | Discharge status: Home / Self Care | Payer: No Typology Code available for payment source | Attending: Emergency Medicine | Admitting: Emergency Medicine

## 2021-08-15 DIAGNOSIS — R519 Headache, unspecified: Secondary | ICD-10-CM | POA: Insufficient documentation

## 2021-08-15 DIAGNOSIS — S299XXA Unspecified injury of thorax, initial encounter: Secondary | ICD-10-CM | POA: Diagnosis present

## 2021-08-15 DIAGNOSIS — M542 Cervicalgia: Secondary | ICD-10-CM | POA: Diagnosis not present

## 2021-08-15 DIAGNOSIS — Y92410 Unspecified street and highway as the place of occurrence of the external cause: Secondary | ICD-10-CM | POA: Diagnosis not present

## 2021-08-15 DIAGNOSIS — T148XXA Other injury of unspecified body region, initial encounter: Secondary | ICD-10-CM

## 2021-08-15 NOTE — Discharge Instructions (Signed)
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
